# Patient Record
Sex: Male | Born: 1935 | Race: White | Hispanic: No
Health system: Southern US, Community
[De-identification: ages and names within clinical notes are randomized; demographics above are authoritative.]

## PROBLEM LIST (undated history)

## (undated) DIAGNOSIS — I480 Paroxysmal atrial fibrillation: Secondary | ICD-10-CM

## (undated) DIAGNOSIS — E43 Unspecified severe protein-calorie malnutrition: Secondary | ICD-10-CM

## (undated) DIAGNOSIS — Z9911 Dependence on respirator [ventilator] status: Secondary | ICD-10-CM

## (undated) DIAGNOSIS — N390 Urinary tract infection, site not specified: Secondary | ICD-10-CM

## (undated) DIAGNOSIS — J449 Chronic obstructive pulmonary disease, unspecified: Secondary | ICD-10-CM

## (undated) DIAGNOSIS — G8250 Quadriplegia, unspecified: Secondary | ICD-10-CM

---

## 2012-10-05 ENCOUNTER — Ambulatory Visit: Payer: Self-pay | Admitting: Internal Medicine

## 2012-10-21 ENCOUNTER — Inpatient Hospital Stay: Payer: Self-pay | Admitting: Student

## 2012-10-21 LAB — COMPREHENSIVE METABOLIC PANEL
Albumin: 3 g/dL — ABNORMAL LOW (ref 3.4–5.0)
Alkaline Phosphatase: 141 U/L — ABNORMAL HIGH (ref 50–136)
Anion Gap: 5 — ABNORMAL LOW (ref 7–16)
BUN: 61 mg/dL — ABNORMAL HIGH (ref 7–18)
Bilirubin,Total: 1.3 mg/dL — ABNORMAL HIGH (ref 0.2–1.0)
Calcium, Total: 10.1 mg/dL (ref 8.5–10.1)
Co2: 37 mmol/L — ABNORMAL HIGH (ref 21–32)
Creatinine: 1.19 mg/dL (ref 0.60–1.30)
EGFR (African American): >60
EGFR (Non-African Amer.): 54 — ABNORMAL LOW
Osmolality: 321 (ref 275–301)
Potassium: 3.7 mmol/L (ref 3.5–5.1)
SGOT(AST): 48 U/L — ABNORMAL HIGH (ref 15–37)
SGPT (ALT): 33 U/L (ref 12–78)
Sodium: 153 mmol/L — ABNORMAL HIGH (ref 136–145)
Total Protein: 7.1 g/dL (ref 6.4–8.2)

## 2012-10-21 LAB — CBC WITH DIFFERENTIAL/PLATELET
Bands: 15 %
HCT: 44.7 %
HGB: 14.1 g/dL
Lymphocytes: 11 %
MCH: 28.2 pg
MCHC: 31.5 g/dL — ABNORMAL LOW
MCV: 90 fL
Metamyelocyte: 2 %
Monocytes: 5 %
Platelet: 130 x10 3/mm 3 — ABNORMAL LOW
RBC: 5 x10 6/mm 3
RDW: 17 % — ABNORMAL HIGH
Segmented Neutrophils: 67 %
Variant Lymphocyte - H1-Rlymph: 1 %
WBC: 24.9 x10 3/mm 3 — ABNORMAL HIGH

## 2012-10-21 LAB — CK-MB: CK-MB: 5 ng/mL — ABNORMAL HIGH (ref 0.5–3.6)

## 2012-10-21 LAB — URINALYSIS, COMPLETE
Bacteria: NONE SEEN
Bilirubin,UR: NEGATIVE
Glucose,UR: NEGATIVE mg/dL (ref 0–75)
Ketone: NEGATIVE
RBC,UR: 59 /HPF (ref 0–5)
Specific Gravity: 1.018 (ref 1.003–1.030)
Squamous Epithelial: NONE SEEN
WBC UR: 14 /HPF (ref 0–5)

## 2012-10-21 LAB — TROPONIN I: Troponin-I: 0.27 ng/mL — ABNORMAL HIGH

## 2012-10-22 LAB — BASIC METABOLIC PANEL
Anion Gap: 6 — ABNORMAL LOW (ref 7–16)
Anion Gap: 5 — ABNORMAL LOW (ref 7–16)
BUN: 46 mg/dL — ABNORMAL HIGH (ref 7–18)
BUN: 38 mg/dL — ABNORMAL HIGH (ref 7–18)
Calcium, Total: 9.5 mg/dL (ref 8.5–10.1)
Calcium, Total: 9.4 mg/dL (ref 8.5–10.1)
Chloride: 114 mmol/L — ABNORMAL HIGH (ref 98–107)
Co2: 32 mmol/L (ref 21–32)
EGFR (African American): >60
EGFR (Non-African Amer.): 56 — ABNORMAL LOW
EGFR (Non-African Amer.): 54 — ABNORMAL LOW
Glucose: 203 mg/dL — ABNORMAL HIGH (ref 65–99)
Osmolality: 315 (ref 275–301)
Potassium: 3.5 mmol/L (ref 3.5–5.1)
Potassium: 3.6 mmol/L (ref 3.5–5.1)
Sodium: 151 mmol/L — ABNORMAL HIGH (ref 136–145)

## 2012-10-22 LAB — CBC WITH DIFFERENTIAL/PLATELET
Eosinophil #: 0.1 10*3/uL (ref 0.0–0.7)
Eosinophil %: 0.4 %
HCT: 41.7 % (ref 40.0–52.0)
HGB: 13.1 g/dL (ref 13.0–18.0)
Lymphocyte #: 1.2 10*3/uL (ref 1.0–3.6)
Lymphocyte %: 5.1 %
MCH: 28.3 pg (ref 26.0–34.0)
MCV: 90 fL (ref 80–100)
Monocyte #: 1.7 x10 3/mm — ABNORMAL HIGH (ref 0.2–1.0)
Monocyte %: 7.4 %
Neutrophil %: 86.8 %
Platelet: 131 10*3/uL — ABNORMAL LOW (ref 150–440)
RBC: 4.62 10*6/uL (ref 4.40–5.90)
RDW: 16.4 % — ABNORMAL HIGH (ref 11.5–14.5)
WBC: 23 10*3/uL — ABNORMAL HIGH (ref 3.8–10.6)

## 2012-10-22 LAB — DIGOXIN LEVEL: Digoxin: 1.15 ng/mL

## 2012-10-23 LAB — BASIC METABOLIC PANEL
Creatinine: 1.11 mg/dL (ref 0.60–1.30)
EGFR (Non-African Amer.): 59 — ABNORMAL LOW
Glucose: 169 mg/dL — ABNORMAL HIGH (ref 65–99)
Osmolality: 306 (ref 275–301)
Potassium: 3.2 mmol/L — ABNORMAL LOW (ref 3.5–5.1)
Sodium: 148 mmol/L — ABNORMAL HIGH (ref 136–145)

## 2012-10-23 LAB — CBC WITH DIFFERENTIAL/PLATELET
Basophil #: 0 10*3/uL (ref 0.0–0.1)
Eosinophil #: 0.2 10*3/uL (ref 0.0–0.7)
HGB: 12.2 g/dL — ABNORMAL LOW (ref 13.0–18.0)
Lymphocyte #: 1.2 10*3/uL (ref 1.0–3.6)
MCV: 90 fL (ref 80–100)
Monocyte #: 1.6 x10 3/mm — ABNORMAL HIGH (ref 0.2–1.0)
Neutrophil #: 16.1 10*3/uL — ABNORMAL HIGH (ref 1.4–6.5)
Platelet: 125 10*3/uL — ABNORMAL LOW (ref 150–440)
RBC: 4.3 10*6/uL — ABNORMAL LOW (ref 4.40–5.90)
RDW: 16.5 % — ABNORMAL HIGH (ref 11.5–14.5)
WBC: 19.1 10*3/uL — ABNORMAL HIGH (ref 3.8–10.6)

## 2012-10-24 LAB — BASIC METABOLIC PANEL
Anion Gap: 4 — ABNORMAL LOW (ref 7–16)
Calcium, Total: 8.6 mg/dL (ref 8.5–10.1)
Chloride: 111 mmol/L — ABNORMAL HIGH (ref 98–107)
Co2: 33 mmol/L — ABNORMAL HIGH (ref 21–32)
EGFR (African American): >60
Osmolality: 300 (ref 275–301)

## 2012-10-24 LAB — CBC WITH DIFFERENTIAL/PLATELET
Eosinophil #: 0.3 10*3/uL (ref 0.0–0.7)
HCT: 37.3 % — ABNORMAL LOW (ref 40.0–52.0)
HGB: 11.7 g/dL — ABNORMAL LOW (ref 13.0–18.0)
Lymphocyte %: 8.9 %
MCHC: 31.4 g/dL — ABNORMAL LOW (ref 32.0–36.0)
MCV: 90 fL (ref 80–100)
Monocyte #: 1.1 x10 3/mm — ABNORMAL HIGH (ref 0.2–1.0)
Monocyte %: 8.8 %
Neutrophil %: 79.1 %
WBC: 12.7 10*3/uL — ABNORMAL HIGH (ref 3.8–10.6)

## 2012-10-25 LAB — CBC WITH DIFFERENTIAL/PLATELET
Basophil #: 0.1 10*3/uL (ref 0.0–0.1)
Eosinophil #: 0.4 10*3/uL (ref 0.0–0.7)
HCT: 36.2 % — ABNORMAL LOW (ref 40.0–52.0)
Lymphocyte %: 11 %
MCH: 28.7 pg (ref 26.0–34.0)
MCHC: 32.2 g/dL (ref 32.0–36.0)
MCV: 89 fL (ref 80–100)
Monocyte #: 0.9 x10 3/mm (ref 0.2–1.0)
Monocyte %: 11 %
Neutrophil %: 72.4 %
Platelet: 137 10*3/uL — ABNORMAL LOW (ref 150–440)
RDW: 16.9 % — ABNORMAL HIGH (ref 11.5–14.5)
WBC: 8.3 10*3/uL (ref 3.8–10.6)

## 2012-10-25 LAB — BASIC METABOLIC PANEL
BUN: 26 mg/dL — ABNORMAL HIGH (ref 7–18)
Calcium, Total: 8.4 mg/dL — ABNORMAL LOW (ref 8.5–10.1)
EGFR (Non-African Amer.): >60
Osmolality: 295 (ref 275–301)
Potassium: 3.9 mmol/L (ref 3.5–5.1)
Sodium: 146 mmol/L — ABNORMAL HIGH (ref 136–145)

## 2012-10-26 LAB — CULTURE, BLOOD (SINGLE)

## 2012-10-26 LAB — URINE CULTURE

## 2012-11-02 ENCOUNTER — Ambulatory Visit: Payer: Self-pay | Admitting: Internal Medicine

## 2012-12-03 ENCOUNTER — Ambulatory Visit: Payer: Self-pay | Admitting: Internal Medicine

## 2012-12-14 ENCOUNTER — Inpatient Hospital Stay: Payer: Self-pay | Admitting: Internal Medicine

## 2012-12-14 LAB — URINALYSIS, COMPLETE
Ketone: NEGATIVE
Nitrite: POSITIVE
Protein: 30
RBC,UR: 14 /HPF (ref 0–5)
Specific Gravity: 1.023 (ref 1.003–1.030)
Squamous Epithelial: NONE SEEN
WBC UR: 254 /HPF (ref 0–5)

## 2012-12-14 LAB — COMPREHENSIVE METABOLIC PANEL
Albumin: 3 g/dL — ABNORMAL LOW (ref 3.4–5.0)
Alkaline Phosphatase: 118 U/L (ref 50–136)
Anion Gap: 3 — ABNORMAL LOW (ref 7–16)
BUN: 35 mg/dL — ABNORMAL HIGH (ref 7–18)
Bilirubin,Total: 2 mg/dL — ABNORMAL HIGH (ref 0.2–1.0)
Calcium, Total: 9 mg/dL (ref 8.5–10.1)
Co2: 38 mmol/L — ABNORMAL HIGH (ref 21–32)
EGFR (African American): >60
Glucose: 111 mg/dL — ABNORMAL HIGH (ref 65–99)
Osmolality: 284 (ref 275–301)
Potassium: 4 mmol/L (ref 3.5–5.1)
SGPT (ALT): 20 U/L (ref 12–78)
Sodium: 138 mmol/L (ref 136–145)

## 2012-12-14 LAB — CBC
HCT: 33.2 % — ABNORMAL LOW (ref 40.0–52.0)
MCHC: 31.3 g/dL — ABNORMAL LOW (ref 32.0–36.0)
MCV: 93 fL (ref 80–100)
Platelet: 149 10*3/uL — ABNORMAL LOW (ref 150–440)
RBC: 3.57 10*6/uL — ABNORMAL LOW (ref 4.40–5.90)
RDW: 16.5 % — ABNORMAL HIGH (ref 11.5–14.5)
WBC: 15.4 10*3/uL — ABNORMAL HIGH (ref 3.8–10.6)

## 2012-12-15 LAB — CBC WITH DIFFERENTIAL/PLATELET
Basophil #: 0 10*3/uL (ref 0.0–0.1)
Basophil %: 0.2 %
Eosinophil %: 0 %
HCT: 35 % — ABNORMAL LOW (ref 40.0–52.0)
MCV: 94 fL (ref 80–100)
Monocyte #: 0.2 x10 3/mm (ref 0.2–1.0)
Neutrophil #: 11.1 10*3/uL — ABNORMAL HIGH (ref 1.4–6.5)
Neutrophil %: 93.2 %
WBC: 11.9 10*3/uL — ABNORMAL HIGH (ref 3.8–10.6)

## 2012-12-15 LAB — BASIC METABOLIC PANEL
Anion Gap: 4 — ABNORMAL LOW (ref 7–16)
BUN: 26 mg/dL — ABNORMAL HIGH (ref 7–18)
Calcium, Total: 8.9 mg/dL (ref 8.5–10.1)
Chloride: 101 mmol/L (ref 98–107)
EGFR (African American): >60
EGFR (Non-African Amer.): >60
Osmolality: 288 (ref 275–301)

## 2012-12-15 LAB — URINE CULTURE

## 2012-12-16 LAB — BASIC METABOLIC PANEL
BUN: 36 mg/dL — ABNORMAL HIGH (ref 7–18)
Chloride: 109 mmol/L — ABNORMAL HIGH (ref 98–107)
Co2: 34 mmol/L — ABNORMAL HIGH (ref 21–32)
EGFR (Non-African Amer.): >60
Osmolality: 296 (ref 275–301)
Potassium: 4.2 mmol/L (ref 3.5–5.1)
Sodium: 145 mmol/L (ref 136–145)

## 2012-12-16 LAB — PRO B NATRIURETIC PEPTIDE: B-Type Natriuretic Peptide: 3560 pg/mL — ABNORMAL HIGH (ref 0–450)

## 2012-12-17 LAB — BASIC METABOLIC PANEL
Calcium, Total: 8.6 mg/dL (ref 8.5–10.1)
Chloride: 113 mmol/L — ABNORMAL HIGH (ref 98–107)
Creatinine: 1.03 mg/dL (ref 0.60–1.30)
EGFR (African American): >60
EGFR (Non-African Amer.): >60
Osmolality: 306 (ref 275–301)
Potassium: 4 mmol/L (ref 3.5–5.1)
Sodium: 148 mmol/L — ABNORMAL HIGH (ref 136–145)

## 2012-12-17 LAB — CBC WITH DIFFERENTIAL/PLATELET
Basophil #: 0.1 10*3/uL (ref 0.0–0.1)
Eosinophil %: 1.3 %
HGB: 11.2 g/dL — ABNORMAL LOW (ref 13.0–18.0)
Lymphocyte #: 1.1 10*3/uL (ref 1.0–3.6)
Lymphocyte %: 9.2 %
MCH: 29.3 pg (ref 26.0–34.0)
MCV: 94 fL (ref 80–100)
Monocyte %: 9.2 %
Neutrophil #: 9.6 10*3/uL — ABNORMAL HIGH (ref 1.4–6.5)
Neutrophil %: 79.5 %
RDW: 17 % — ABNORMAL HIGH (ref 11.5–14.5)
WBC: 12 10*3/uL — ABNORMAL HIGH (ref 3.8–10.6)

## 2012-12-18 LAB — CBC WITH DIFFERENTIAL/PLATELET
Eosinophil %: 3 %
HCT: 34.9 % — ABNORMAL LOW (ref 40.0–52.0)
Lymphocyte %: 14.6 %
MCH: 29.7 pg (ref 26.0–34.0)
MCV: 94 fL (ref 80–100)
Monocyte #: 1.2 x10 3/mm — ABNORMAL HIGH (ref 0.2–1.0)
Neutrophil #: 7.5 10*3/uL — ABNORMAL HIGH (ref 1.4–6.5)
Platelet: 173 10*3/uL (ref 150–440)
RBC: 3.73 10*6/uL — ABNORMAL LOW (ref 4.40–5.90)
WBC: 10.8 10*3/uL — ABNORMAL HIGH (ref 3.8–10.6)

## 2012-12-18 LAB — BASIC METABOLIC PANEL
BUN: 28 mg/dL — ABNORMAL HIGH (ref 7–18)
Calcium, Total: 8.3 mg/dL — ABNORMAL LOW (ref 8.5–10.1)
EGFR (African American): >60
Sodium: 148 mmol/L — ABNORMAL HIGH (ref 136–145)

## 2012-12-19 LAB — BASIC METABOLIC PANEL
Anion Gap: 3 — ABNORMAL LOW (ref 7–16)
BUN: 28 mg/dL — ABNORMAL HIGH (ref 7–18)
Creatinine: 0.86 mg/dL (ref 0.60–1.30)
EGFR (African American): >60
EGFR (Non-African Amer.): >60
Glucose: 75 mg/dL (ref 65–99)
Osmolality: 293 (ref 275–301)
Potassium: 4.2 mmol/L (ref 3.5–5.1)

## 2012-12-20 LAB — BASIC METABOLIC PANEL
Anion Gap: 1 — ABNORMAL LOW (ref 7–16)
BUN: 27 mg/dL — ABNORMAL HIGH (ref 7–18)
Calcium, Total: 8.5 mg/dL (ref 8.5–10.1)
Co2: 35 mmol/L — ABNORMAL HIGH (ref 21–32)
EGFR (African American): >60
EGFR (Non-African Amer.): >60
Osmolality: 291 (ref 275–301)
Potassium: 3.8 mmol/L (ref 3.5–5.1)
Sodium: 143 mmol/L (ref 136–145)

## 2012-12-20 LAB — CULTURE, BLOOD (SINGLE)

## 2012-12-21 LAB — BASIC METABOLIC PANEL
Calcium, Total: 8.7 mg/dL (ref 8.5–10.1)
Co2: 32 mmol/L (ref 21–32)
EGFR (Non-African Amer.): 58 — ABNORMAL LOW
Glucose: 119 mg/dL — ABNORMAL HIGH (ref 65–99)

## 2012-12-21 LAB — PRO B NATRIURETIC PEPTIDE: B-Type Natriuretic Peptide: 2207 pg/mL — ABNORMAL HIGH (ref 0–450)

## 2012-12-22 LAB — HEMOGLOBIN: HGB: 10.9 g/dL — ABNORMAL LOW (ref 13.0–18.0)

## 2012-12-22 LAB — PLATELET COUNT: Platelet: 198 10*3/uL (ref 150–440)

## 2012-12-23 LAB — BASIC METABOLIC PANEL
Anion Gap: 2 — ABNORMAL LOW (ref 7–16)
BUN: 27 mg/dL — ABNORMAL HIGH (ref 7–18)
Calcium, Total: 9 mg/dL (ref 8.5–10.1)
Chloride: 105 mmol/L (ref 98–107)
Creatinine: 0.98 mg/dL (ref 0.60–1.30)
EGFR (African American): >60
EGFR (Non-African Amer.): >60
Glucose: 109 mg/dL — ABNORMAL HIGH (ref 65–99)
Osmolality: 285 (ref 275–301)

## 2012-12-24 LAB — CBC WITH DIFFERENTIAL/PLATELET
Eosinophil %: 3.9 %
HCT: 34.2 % — ABNORMAL LOW (ref 40.0–52.0)
HGB: 10.7 g/dL — ABNORMAL LOW (ref 13.0–18.0)
Lymphocyte #: 1.6 10*3/uL (ref 1.0–3.6)
Lymphocyte %: 12.2 %
MCH: 29.6 pg (ref 26.0–34.0)
MCHC: 31.3 g/dL — ABNORMAL LOW (ref 32.0–36.0)
MCV: 95 fL (ref 80–100)
Monocyte #: 1.1 x10 3/mm — ABNORMAL HIGH (ref 0.2–1.0)
Monocyte %: 8.9 %
RDW: 17.4 % — ABNORMAL HIGH (ref 11.5–14.5)
WBC: 12.8 10*3/uL — ABNORMAL HIGH (ref 3.8–10.6)

## 2012-12-24 LAB — BASIC METABOLIC PANEL
BUN: 26 mg/dL — ABNORMAL HIGH (ref 7–18)
Calcium, Total: 9.2 mg/dL (ref 8.5–10.1)
Chloride: 102 mmol/L (ref 98–107)
EGFR (African American): >60
Glucose: 68 mg/dL (ref 65–99)
Osmolality: 279 (ref 275–301)
Sodium: 138 mmol/L (ref 136–145)

## 2012-12-25 LAB — CBC WITH DIFFERENTIAL/PLATELET
Basophil #: 0.1 10*3/uL (ref 0.0–0.1)
Eosinophil #: 0.4 10*3/uL (ref 0.0–0.7)
Eosinophil %: 3.2 %
HCT: 33.6 % — ABNORMAL LOW (ref 40.0–52.0)
HGB: 10.7 g/dL — ABNORMAL LOW (ref 13.0–18.0)
Lymphocyte %: 13.9 %
MCHC: 31.9 g/dL — ABNORMAL LOW (ref 32.0–36.0)
Neutrophil #: 7.9 10*3/uL — ABNORMAL HIGH (ref 1.4–6.5)
Neutrophil %: 71.6 %
RBC: 3.6 10*6/uL — ABNORMAL LOW (ref 4.40–5.90)
RDW: 17.1 % — ABNORMAL HIGH (ref 11.5–14.5)
WBC: 11 10*3/uL — ABNORMAL HIGH (ref 3.8–10.6)

## 2012-12-26 LAB — BASIC METABOLIC PANEL
Anion Gap: 3 — ABNORMAL LOW (ref 7–16)
BUN: 25 mg/dL — ABNORMAL HIGH (ref 7–18)
Chloride: 106 mmol/L (ref 98–107)
Creatinine: 0.96 mg/dL (ref 0.60–1.30)
EGFR (African American): >60
Glucose: 98 mg/dL (ref 65–99)
Osmolality: 286 (ref 275–301)
Potassium: 5 mmol/L (ref 3.5–5.1)

## 2012-12-26 LAB — CBC WITH DIFFERENTIAL/PLATELET
Basophil %: 1 %
Eosinophil #: 0.4 10*3/uL (ref 0.0–0.7)
Eosinophil %: 3.7 %
HCT: 33.3 % — ABNORMAL LOW (ref 40.0–52.0)
HGB: 10.6 g/dL — ABNORMAL LOW (ref 13.0–18.0)
Lymphocyte %: 13.8 %
MCHC: 31.9 g/dL — ABNORMAL LOW (ref 32.0–36.0)
MCV: 94 fL (ref 80–100)
Monocyte #: 1.1 x10 3/mm — ABNORMAL HIGH (ref 0.2–1.0)
Monocyte %: 10.5 %
Neutrophil #: 7.3 10*3/uL — ABNORMAL HIGH (ref 1.4–6.5)
Platelet: 209 10*3/uL (ref 150–440)
RBC: 3.52 10*6/uL — ABNORMAL LOW (ref 4.40–5.90)
RDW: 17 % — ABNORMAL HIGH (ref 11.5–14.5)

## 2012-12-26 LAB — MAGNESIUM: Magnesium: 1.9 mg/dL

## 2013-01-02 ENCOUNTER — Ambulatory Visit: Payer: Self-pay | Admitting: Internal Medicine

## 2013-02-19 ENCOUNTER — Inpatient Hospital Stay: Payer: Self-pay | Admitting: Internal Medicine

## 2013-02-19 LAB — COMPREHENSIVE METABOLIC PANEL
Alkaline Phosphatase: 195 U/L — ABNORMAL HIGH (ref 50–136)
Anion Gap: 5 — ABNORMAL LOW (ref 7–16)
BUN: 37 mg/dL — ABNORMAL HIGH (ref 7–18)
Bilirubin,Total: 0.8 mg/dL (ref 0.2–1.0)
Calcium, Total: 9.9 mg/dL (ref 8.5–10.1)
Creatinine: 0.92 mg/dL (ref 0.60–1.30)
EGFR (Non-African Amer.): >60
Glucose: 92 mg/dL (ref 65–99)
SGOT(AST): 64 U/L — ABNORMAL HIGH (ref 15–37)
SGPT (ALT): 54 U/L (ref 12–78)
Sodium: 143 mmol/L (ref 136–145)
Total Protein: 7 g/dL (ref 6.4–8.2)

## 2013-02-19 LAB — URINALYSIS, COMPLETE
RBC,UR: 13572 /HPF (ref 0–5)
Squamous Epithelial: NONE SEEN

## 2013-02-19 LAB — CBC
HCT: 39 % — ABNORMAL LOW (ref 40.0–52.0)
MCV: 87 fL (ref 80–100)

## 2013-02-20 LAB — HEMOGLOBIN: HGB: 11.9 g/dL — ABNORMAL LOW (ref 13.0–18.0)

## 2013-02-20 LAB — CBC WITH DIFFERENTIAL/PLATELET
Eosinophil %: 2.2 %
HCT: 37.2 % — ABNORMAL LOW (ref 40.0–52.0)
Lymphocyte #: 2 10*3/uL (ref 1.0–3.6)
Lymphocyte %: 15.6 %
MCH: 27.5 pg (ref 26.0–34.0)
MCHC: 31.8 g/dL — ABNORMAL LOW (ref 32.0–36.0)
MCV: 86 fL (ref 80–100)
Monocyte #: 1.2 x10 3/mm — ABNORMAL HIGH (ref 0.2–1.0)
Neutrophil #: 9.3 10*3/uL — ABNORMAL HIGH (ref 1.4–6.5)
Neutrophil %: 71.6 %
RBC: 4.31 10*6/uL — ABNORMAL LOW (ref 4.40–5.90)
RDW: 16 % — ABNORMAL HIGH (ref 11.5–14.5)
WBC: 13 10*3/uL — ABNORMAL HIGH (ref 3.8–10.6)

## 2013-02-20 LAB — BASIC METABOLIC PANEL
BUN: 42 mg/dL — ABNORMAL HIGH (ref 7–18)
Creatinine: 1.16 mg/dL (ref 0.60–1.30)
EGFR (African American): >60
EGFR (Non-African Amer.): 55 — ABNORMAL LOW
Glucose: 161 mg/dL — ABNORMAL HIGH (ref 65–99)
Osmolality: 297 (ref 275–301)
Potassium: 4.6 mmol/L (ref 3.5–5.1)
Sodium: 142 mmol/L (ref 136–145)

## 2013-04-04 ENCOUNTER — Ambulatory Visit: Payer: Self-pay | Admitting: Internal Medicine

## 2013-04-18 ENCOUNTER — Inpatient Hospital Stay: Payer: Self-pay | Admitting: Internal Medicine

## 2013-04-18 LAB — COMPREHENSIVE METABOLIC PANEL
Alkaline Phosphatase: 178 U/L — ABNORMAL HIGH (ref 50–136)
Anion Gap: 2 — ABNORMAL LOW (ref 7–16)
BUN: 41 mg/dL — ABNORMAL HIGH (ref 7–18)
Bilirubin,Total: 1.6 mg/dL — ABNORMAL HIGH (ref 0.2–1.0)
Calcium, Total: 9.9 mg/dL (ref 8.5–10.1)
Chloride: 101 mmol/L (ref 98–107)
Creatinine: 0.84 mg/dL (ref 0.60–1.30)
EGFR (African American): >60
Glucose: 77 mg/dL (ref 65–99)
Osmolality: 286 (ref 275–301)
Potassium: 5.4 mmol/L — ABNORMAL HIGH (ref 3.5–5.1)
SGOT(AST): 67 U/L — ABNORMAL HIGH (ref 15–37)
SGPT (ALT): 38 U/L (ref 12–78)
Sodium: 139 mmol/L (ref 136–145)
Total Protein: 7.8 g/dL (ref 6.4–8.2)

## 2013-04-18 LAB — TROPONIN I: Troponin-I: 0.07 ng/mL — ABNORMAL HIGH

## 2013-04-18 LAB — CBC
HCT: 39 % — ABNORMAL LOW (ref 40.0–52.0)
HGB: 12.8 g/dL — ABNORMAL LOW (ref 13.0–18.0)
MCV: 85 fL (ref 80–100)
Platelet: 169 10*3/uL (ref 150–440)
RBC: 4.62 10*6/uL (ref 4.40–5.90)

## 2013-04-18 LAB — URINALYSIS, COMPLETE
Glucose,UR: NEGATIVE mg/dL (ref 0–75)
Ketone: NEGATIVE
Protein: NEGATIVE
RBC,UR: 210 /HPF (ref 0–5)
Squamous Epithelial: NONE SEEN
WBC UR: 87 /HPF (ref 0–5)

## 2013-04-18 LAB — DIGOXIN LEVEL: Digoxin: 0.9 ng/mL

## 2013-04-18 LAB — CK TOTAL AND CKMB (NOT AT ARMC): CK, Total: 115 U/L (ref 35–232)

## 2013-04-18 LAB — PRO B NATRIURETIC PEPTIDE: B-Type Natriuretic Peptide: 3020 pg/mL — ABNORMAL HIGH (ref 0–450)

## 2013-04-19 DIAGNOSIS — R4182 Altered mental status, unspecified: Secondary | ICD-10-CM

## 2013-04-19 DIAGNOSIS — I4891 Unspecified atrial fibrillation: Secondary | ICD-10-CM

## 2013-04-19 DIAGNOSIS — I471 Supraventricular tachycardia: Secondary | ICD-10-CM

## 2013-04-19 DIAGNOSIS — R7989 Other specified abnormal findings of blood chemistry: Secondary | ICD-10-CM

## 2013-04-19 LAB — BASIC METABOLIC PANEL
Anion Gap: 7 (ref 7–16)
BUN: 38 mg/dL — ABNORMAL HIGH (ref 7–18)
Calcium, Total: 8.8 mg/dL (ref 8.5–10.1)
Chloride: 108 mmol/L — ABNORMAL HIGH (ref 98–107)
Co2: 29 mmol/L (ref 21–32)
EGFR (African American): >60
Osmolality: 295 (ref 275–301)
Potassium: 4.4 mmol/L (ref 3.5–5.1)
Sodium: 144 mmol/L (ref 136–145)

## 2013-04-19 LAB — TROPONIN I: Troponin-I: 0.08 ng/mL — ABNORMAL HIGH

## 2013-04-19 LAB — CK TOTAL AND CKMB (NOT AT ARMC)
CK, Total: 62 U/L (ref 35–232)
CK-MB: 5.8 ng/mL — ABNORMAL HIGH (ref 0.5–3.6)
CK-MB: 6 ng/mL — ABNORMAL HIGH (ref 0.5–3.6)

## 2013-04-21 LAB — BASIC METABOLIC PANEL
Anion Gap: 0 — ABNORMAL LOW (ref 7–16)
Calcium, Total: 8.7 mg/dL (ref 8.5–10.1)
Co2: 35 mmol/L — ABNORMAL HIGH (ref 21–32)
Creatinine: 0.8 mg/dL (ref 0.60–1.30)
EGFR (Non-African Amer.): >60
Glucose: 105 mg/dL — ABNORMAL HIGH (ref 65–99)
Osmolality: 290 (ref 275–301)
Potassium: 4.4 mmol/L (ref 3.5–5.1)
Sodium: 143 mmol/L (ref 136–145)

## 2013-04-22 LAB — URINALYSIS, COMPLETE
Bacteria: NONE SEEN
Bilirubin,UR: NEGATIVE
Ketone: NEGATIVE
Protein: NEGATIVE
RBC,UR: 569 /HPF (ref 0–5)
Squamous Epithelial: <1

## 2013-04-23 LAB — CULTURE, BLOOD (SINGLE)

## 2013-04-29 ENCOUNTER — Other Ambulatory Visit: Payer: Self-pay | Admitting: Family Medicine

## 2013-04-29 LAB — URINALYSIS, COMPLETE
Bilirubin,UR: NEGATIVE
Ketone: NEGATIVE
Nitrite: NEGATIVE
Ph: 5 (ref 4.5–8.0)
RBC,UR: 1 /HPF (ref 0–5)
Specific Gravity: 1.017 (ref 1.003–1.030)
Squamous Epithelial: <1
WBC UR: 4 /HPF (ref 0–5)

## 2013-04-29 LAB — COMPREHENSIVE METABOLIC PANEL
Albumin: 3.6 g/dL (ref 3.4–5.0)
Anion Gap: 2 — ABNORMAL LOW (ref 7–16)
Creatinine: 1.04 mg/dL (ref 0.60–1.30)
EGFR (African American): >60
Glucose: 122 mg/dL — ABNORMAL HIGH (ref 65–99)
Osmolality: 288 (ref 275–301)
Potassium: 5.1 mmol/L (ref 3.5–5.1)
SGOT(AST): 49 U/L — ABNORMAL HIGH (ref 15–37)
SGPT (ALT): 46 U/L (ref 12–78)
Sodium: 139 mmol/L (ref 136–145)
Total Protein: 7.5 g/dL (ref 6.4–8.2)

## 2013-04-29 LAB — CBC WITH DIFFERENTIAL/PLATELET
Basophil #: 0.1 10*3/uL (ref 0.0–0.1)
Eosinophil %: 5.8 %
HCT: 42.7 % (ref 40.0–52.0)
Lymphocyte #: 1.8 10*3/uL (ref 1.0–3.6)
Lymphocyte %: 15.9 %
MCH: 27.7 pg (ref 26.0–34.0)
MCHC: 32 g/dL (ref 32.0–36.0)
MCV: 86 fL (ref 80–100)
Monocyte %: 8.6 %
Neutrophil #: 7.7 10*3/uL — ABNORMAL HIGH (ref 1.4–6.5)
Neutrophil %: 68.5 %
Platelet: 206 10*3/uL (ref 150–440)
RBC: 4.95 10*6/uL (ref 4.40–5.90)
WBC: 11.3 10*3/uL — ABNORMAL HIGH (ref 3.8–10.6)

## 2013-04-30 LAB — URINE CULTURE

## 2013-05-01 ENCOUNTER — Other Ambulatory Visit: Payer: Self-pay | Admitting: Family Medicine

## 2013-05-05 ENCOUNTER — Ambulatory Visit: Payer: Self-pay | Admitting: Internal Medicine

## 2013-05-05 ENCOUNTER — Emergency Department: Payer: Self-pay | Admitting: Emergency Medicine

## 2013-05-05 LAB — CBC
HCT: 40.9 % (ref 40.0–52.0)
MCHC: 32.8 g/dL (ref 32.0–36.0)
MCV: 86 fL (ref 80–100)
Platelet: 169 10*3/uL (ref 150–440)
RBC: 4.77 10*6/uL (ref 4.40–5.90)
RDW: 17.1 % — ABNORMAL HIGH (ref 11.5–14.5)
WBC: 10.8 10*3/uL — ABNORMAL HIGH (ref 3.8–10.6)

## 2013-05-05 LAB — COMPREHENSIVE METABOLIC PANEL
Albumin: 3.2 g/dL — ABNORMAL LOW (ref 3.4–5.0)
Alkaline Phosphatase: 178 U/L — ABNORMAL HIGH (ref 50–136)
Anion Gap: 0 — ABNORMAL LOW (ref 7–16)
Bilirubin,Total: 0.5 mg/dL (ref 0.2–1.0)
Creatinine: 0.98 mg/dL (ref 0.60–1.30)
EGFR (African American): >60
Glucose: 120 mg/dL — ABNORMAL HIGH (ref 65–99)
SGOT(AST): 53 U/L — ABNORMAL HIGH (ref 15–37)

## 2013-05-23 ENCOUNTER — Inpatient Hospital Stay: Payer: Self-pay | Admitting: Student

## 2013-05-23 LAB — CBC
HCT: 43.9 % (ref 40.0–52.0)
HGB: 13.7 g/dL (ref 13.0–18.0)
MCH: 27.3 pg (ref 26.0–34.0)
MCHC: 31.2 g/dL — ABNORMAL LOW (ref 32.0–36.0)
MCV: 88 fL (ref 80–100)
Platelet: 312 10*3/uL (ref 150–440)
RBC: 5.01 10*6/uL (ref 4.40–5.90)
RDW: 16.8 % — ABNORMAL HIGH (ref 11.5–14.5)
WBC: 16.4 10*3/uL — ABNORMAL HIGH (ref 3.8–10.6)

## 2013-05-23 LAB — URINALYSIS, COMPLETE
Bilirubin,UR: NEGATIVE
Ketone: NEGATIVE
Nitrite: NEGATIVE
Ph: 5 (ref 4.5–8.0)
Protein: 30
RBC,UR: 6 /HPF (ref 0–5)
Squamous Epithelial: <1
WBC UR: 5 /HPF (ref 0–5)

## 2013-05-23 LAB — CBC WITH DIFFERENTIAL/PLATELET
Basophil #: 0.1 10*3/uL (ref 0.0–0.1)
Basophil %: 0.3 %
HGB: 12.2 g/dL — ABNORMAL LOW (ref 13.0–18.0)
MCH: 26.8 pg (ref 26.0–34.0)
MCHC: 31.3 g/dL — ABNORMAL LOW (ref 32.0–36.0)
MCV: 86 fL (ref 80–100)
Monocyte #: 1.8 x10 3/mm — ABNORMAL HIGH (ref 0.2–1.0)
Neutrophil #: 21.2 10*3/uL — ABNORMAL HIGH (ref 1.4–6.5)
Neutrophil %: 87 %
Platelet: 270 10*3/uL (ref 150–440)
WBC: 24.4 10*3/uL — ABNORMAL HIGH (ref 3.8–10.6)

## 2013-05-23 LAB — COMPREHENSIVE METABOLIC PANEL
Bilirubin,Total: 0.7 mg/dL (ref 0.2–1.0)
Calcium, Total: 11.4 mg/dL — ABNORMAL HIGH (ref 8.5–10.1)
Chloride: 108 mmol/L — ABNORMAL HIGH (ref 98–107)
Creatinine: 1.52 mg/dL — ABNORMAL HIGH (ref 0.60–1.30)
EGFR (African American): 46 — ABNORMAL LOW
Glucose: 198 mg/dL — ABNORMAL HIGH (ref 65–99)
Osmolality: 320 (ref 275–301)
Potassium: 4.1 mmol/L (ref 3.5–5.1)
SGPT (ALT): 25 U/L (ref 12–78)

## 2013-05-23 LAB — CK: CK, Total: 36 U/L (ref 35–232)

## 2013-05-23 LAB — BASIC METABOLIC PANEL
Anion Gap: 8 (ref 7–16)
BUN: 45 mg/dL — ABNORMAL HIGH (ref 7–18)
Glucose: 129 mg/dL — ABNORMAL HIGH (ref 65–99)
Osmolality: 317 (ref 275–301)
Sodium: 153 mmol/L — ABNORMAL HIGH (ref 136–145)

## 2013-05-23 LAB — AMMONIA: Ammonia, Plasma: <25 mcmol/L (ref 11–32)

## 2013-05-23 LAB — TROPONIN I: Troponin-I: 0.15 ng/mL — ABNORMAL HIGH

## 2013-05-23 LAB — PHOSPHORUS: Phosphorus: 7.5 mg/dL — ABNORMAL HIGH (ref 2.5–4.9)

## 2013-05-24 LAB — CBC WITH DIFFERENTIAL/PLATELET
Eosinophil #: 0 10*3/uL (ref 0.0–0.7)
HCT: 36.1 % — ABNORMAL LOW (ref 40.0–52.0)
MCHC: 32.4 g/dL (ref 32.0–36.0)
MCV: 84 fL (ref 80–100)
Neutrophil #: 18.1 10*3/uL — ABNORMAL HIGH (ref 1.4–6.5)
Platelet: 262 10*3/uL (ref 150–440)
RBC: 4.28 10*6/uL — ABNORMAL LOW (ref 4.40–5.90)
RDW: 15.9 % — ABNORMAL HIGH (ref 11.5–14.5)

## 2013-05-24 LAB — BASIC METABOLIC PANEL
Anion Gap: 5 — ABNORMAL LOW (ref 7–16)
BUN: 36 mg/dL — ABNORMAL HIGH (ref 7–18)
Chloride: 112 mmol/L — ABNORMAL HIGH (ref 98–107)
Co2: 30 mmol/L (ref 21–32)
Co2: 28 mmol/L (ref 21–32)
Creatinine: 1.38 mg/dL — ABNORMAL HIGH (ref 0.60–1.30)
EGFR (Non-African Amer.): 45 — ABNORMAL LOW
EGFR (Non-African Amer.): 52 — ABNORMAL LOW
Glucose: 149 mg/dL — ABNORMAL HIGH (ref 65–99)
Glucose: 155 mg/dL — ABNORMAL HIGH (ref 65–99)
Osmolality: 305 (ref 275–301)
Osmolality: 293 (ref 275–301)

## 2013-05-24 LAB — CK-MB: CK-MB: 2.3 ng/mL (ref 0.5–3.6)

## 2013-05-24 LAB — PHOSPHORUS
Phosphorus: 1 mg/dL — CL (ref 2.5–4.9)
Phosphorus: 5.5 mg/dL — ABNORMAL HIGH (ref 2.5–4.9)

## 2013-05-24 LAB — TROPONIN I: Troponin-I: 0.14 ng/mL — ABNORMAL HIGH

## 2013-05-24 LAB — DIGOXIN LEVEL: Digoxin: 0.51 ng/mL

## 2013-05-24 LAB — MAGNESIUM
Magnesium: 2 mg/dL
Magnesium: 1.8 mg/dL

## 2013-05-25 LAB — CBC WITH DIFFERENTIAL/PLATELET
Basophil #: 0.1 10*3/uL (ref 0.0–0.1)
Basophil %: 0.6 %
HCT: 33.8 % — ABNORMAL LOW (ref 40.0–52.0)
HGB: 10.6 g/dL — ABNORMAL LOW (ref 13.0–18.0)
Lymphocyte #: 0.7 10*3/uL — ABNORMAL LOW (ref 1.0–3.6)
Monocyte #: 0.8 x10 3/mm (ref 0.2–1.0)
Neutrophil #: 14.4 10*3/uL — ABNORMAL HIGH (ref 1.4–6.5)
Neutrophil %: 90.4 %
RDW: 16.7 % — ABNORMAL HIGH (ref 11.5–14.5)
WBC: 15.9 10*3/uL — ABNORMAL HIGH (ref 3.8–10.6)

## 2013-05-25 LAB — BASIC METABOLIC PANEL
Calcium, Total: 7.2 mg/dL — ABNORMAL LOW (ref 8.5–10.1)
Chloride: 107 mmol/L (ref 98–107)
Creatinine: 1.26 mg/dL (ref 0.60–1.30)
EGFR (Non-African Amer.): 50 — ABNORMAL LOW
Glucose: 120 mg/dL — ABNORMAL HIGH (ref 65–99)
Osmolality: 287 (ref 275–301)

## 2013-05-25 LAB — URINE CULTURE

## 2013-05-25 LAB — PHOSPHORUS: Phosphorus: 4.3 mg/dL (ref 2.5–4.9)

## 2013-05-26 LAB — CBC WITH DIFFERENTIAL/PLATELET
Basophil #: 0 10*3/uL (ref 0.0–0.1)
Basophil %: 0.2 %
Eosinophil %: 0 %
HGB: 10.5 g/dL — ABNORMAL LOW (ref 13.0–18.0)
Lymphocyte %: 6.1 %
MCH: 27.6 pg (ref 26.0–34.0)
MCHC: 32 g/dL (ref 32.0–36.0)
MCV: 86 fL (ref 80–100)
Monocyte #: 0.4 x10 3/mm (ref 0.2–1.0)
Neutrophil #: 6.9 10*3/uL — ABNORMAL HIGH (ref 1.4–6.5)
Platelet: 161 10*3/uL (ref 150–440)
RBC: 3.79 10*6/uL — ABNORMAL LOW (ref 4.40–5.90)
RDW: 16.6 % — ABNORMAL HIGH (ref 11.5–14.5)

## 2013-05-26 LAB — COMPREHENSIVE METABOLIC PANEL
Alkaline Phosphatase: 92 U/L (ref 50–136)
Anion Gap: 6 — ABNORMAL LOW (ref 7–16)
BUN: 50 mg/dL — ABNORMAL HIGH (ref 7–18)
Bilirubin,Total: 0.4 mg/dL (ref 0.2–1.0)
Calcium, Total: 7.1 mg/dL — ABNORMAL LOW (ref 8.5–10.1)
EGFR (African American): 58 — ABNORMAL LOW
Osmolality: 287 (ref 275–301)
Potassium: 4.3 mmol/L (ref 3.5–5.1)
SGOT(AST): 20 U/L (ref 15–37)
SGPT (ALT): 14 U/L (ref 12–78)
Sodium: 136 mmol/L (ref 136–145)

## 2013-05-26 LAB — MAGNESIUM: Magnesium: 2 mg/dL

## 2013-05-27 LAB — CBC WITH DIFFERENTIAL/PLATELET
Basophil %: 0.1 %
Eosinophil #: 0 10*3/uL (ref 0.0–0.7)
Eosinophil %: 0 %
HCT: 33.1 % — ABNORMAL LOW (ref 40.0–52.0)
MCH: 27.8 pg (ref 26.0–34.0)
MCHC: 32.4 g/dL (ref 32.0–36.0)
MCV: 86 fL (ref 80–100)
Monocyte #: 0.5 x10 3/mm (ref 0.2–1.0)
Monocyte %: 5.1 %
Platelet: 157 10*3/uL (ref 150–440)
RBC: 3.85 10*6/uL — ABNORMAL LOW (ref 4.40–5.90)
RDW: 16.6 % — ABNORMAL HIGH (ref 11.5–14.5)
WBC: 9 10*3/uL (ref 3.8–10.6)

## 2013-05-27 LAB — COMPREHENSIVE METABOLIC PANEL
Calcium, Total: 7.8 mg/dL — ABNORMAL LOW (ref 8.5–10.1)
EGFR (African American): >60
EGFR (Non-African Amer.): 56 — ABNORMAL LOW
Potassium: 3.9 mmol/L (ref 3.5–5.1)
SGOT(AST): 16 U/L (ref 15–37)
SGPT (ALT): 16 U/L (ref 12–78)
Total Protein: 5.3 g/dL — ABNORMAL LOW (ref 6.4–8.2)

## 2013-05-28 LAB — CULTURE, BLOOD (SINGLE)

## 2013-05-29 LAB — CBC WITH DIFFERENTIAL/PLATELET
Basophil #: 0 10*3/uL (ref 0.0–0.1)
Basophil %: 0.3 %
Eosinophil %: 0 %
HCT: 37 % — ABNORMAL LOW (ref 40.0–52.0)
HGB: 11.9 g/dL — ABNORMAL LOW (ref 13.0–18.0)
Lymphocyte #: 0.4 10*3/uL — ABNORMAL LOW (ref 1.0–3.6)
Lymphocyte %: 2.5 %
MCHC: 32 g/dL (ref 32.0–36.0)
MCV: 86 fL (ref 80–100)
Monocyte %: 5.6 %
RDW: 17.4 % — ABNORMAL HIGH (ref 11.5–14.5)
WBC: 14.7 10*3/uL — ABNORMAL HIGH (ref 3.8–10.6)

## 2013-05-29 LAB — BASIC METABOLIC PANEL
Calcium, Total: 8 mg/dL — ABNORMAL LOW (ref 8.5–10.1)
Creatinine: 1.1 mg/dL (ref 0.60–1.30)
EGFR (Non-African Amer.): 59 — ABNORMAL LOW
Potassium: 4.9 mmol/L (ref 3.5–5.1)
Sodium: 151 mmol/L — ABNORMAL HIGH (ref 136–145)

## 2013-05-30 LAB — POTASSIUM: Potassium: 5.5 mmol/L — ABNORMAL HIGH (ref 3.5–5.1)

## 2013-05-30 LAB — BASIC METABOLIC PANEL
BUN: 45 mg/dL — ABNORMAL HIGH (ref 7–18)
Calcium, Total: 9 mg/dL (ref 8.5–10.1)
Co2: 38 mmol/L — ABNORMAL HIGH (ref 21–32)
EGFR (Non-African Amer.): 59 — ABNORMAL LOW
Glucose: 152 mg/dL — ABNORMAL HIGH (ref 65–99)
Potassium: 4.6 mmol/L (ref 3.5–5.1)
Sodium: 153 mmol/L — ABNORMAL HIGH (ref 136–145)

## 2013-05-31 LAB — CBC WITH DIFFERENTIAL/PLATELET
Basophil #: 0 10*3/uL (ref 0.0–0.1)
Eosinophil #: 0 10*3/uL (ref 0.0–0.7)
HGB: 9.9 g/dL — ABNORMAL LOW (ref 13.0–18.0)
Lymphocyte #: 0.6 10*3/uL — ABNORMAL LOW (ref 1.0–3.6)
Lymphocyte %: 4.8 %
MCH: 27.4 pg (ref 26.0–34.0)
Monocyte %: 9.7 %
Neutrophil %: 85.3 %
WBC: 13.2 10*3/uL — ABNORMAL HIGH (ref 3.8–10.6)

## 2013-05-31 LAB — BASIC METABOLIC PANEL
BUN: 39 mg/dL — ABNORMAL HIGH (ref 7–18)
Co2: 37 mmol/L — ABNORMAL HIGH (ref 21–32)
Creatinine: 1.02 mg/dL (ref 0.60–1.30)
EGFR (African American): >60
EGFR (Non-African Amer.): >60
Glucose: 121 mg/dL — ABNORMAL HIGH (ref 65–99)
Potassium: 3.7 mmol/L (ref 3.5–5.1)
Sodium: 153 mmol/L — ABNORMAL HIGH (ref 136–145)

## 2013-05-31 LAB — MAGNESIUM: Magnesium: 2.1 mg/dL

## 2013-06-01 LAB — CBC WITH DIFFERENTIAL/PLATELET
Eosinophil #: 0 10*3/uL (ref 0.0–0.7)
Eosinophil %: 0 %
HGB: 10 g/dL — ABNORMAL LOW (ref 13.0–18.0)
Lymphocyte #: 0.4 10*3/uL — ABNORMAL LOW (ref 1.0–3.6)
Lymphocyte %: 2.5 %
MCV: 86 fL (ref 80–100)
Neutrophil #: 13.2 10*3/uL — ABNORMAL HIGH (ref 1.4–6.5)
Platelet: 135 10*3/uL — ABNORMAL LOW (ref 150–440)
RDW: 17 % — ABNORMAL HIGH (ref 11.5–14.5)
WBC: 15.1 10*3/uL — ABNORMAL HIGH (ref 3.8–10.6)

## 2013-06-01 LAB — BASIC METABOLIC PANEL
Calcium, Total: 7.6 mg/dL — ABNORMAL LOW (ref 8.5–10.1)
Chloride: 111 mmol/L — ABNORMAL HIGH (ref 98–107)
Co2: 37 mmol/L — ABNORMAL HIGH (ref 21–32)
EGFR (African American): >60
Potassium: 3.8 mmol/L (ref 3.5–5.1)
Sodium: 149 mmol/L — ABNORMAL HIGH (ref 136–145)

## 2013-06-02 ENCOUNTER — Inpatient Hospital Stay
Admission: AD | Admit: 2013-06-02 | Discharge: 2013-07-11 | Disposition: A | Discharge status: Home Health | Payer: MEDICARE | Source: Ambulatory Visit | Attending: Internal Medicine | Admitting: Internal Medicine

## 2013-06-02 DIAGNOSIS — Z9911 Dependence on respirator [ventilator] status: Secondary | ICD-10-CM

## 2013-06-02 DIAGNOSIS — J449 Chronic obstructive pulmonary disease, unspecified: Secondary | ICD-10-CM

## 2013-06-02 DIAGNOSIS — J9600 Acute respiratory failure, unspecified whether with hypoxia or hypercapnia: Secondary | ICD-10-CM

## 2013-06-02 DIAGNOSIS — S129XXA Fracture of neck, unspecified, initial encounter: Secondary | ICD-10-CM

## 2013-06-02 DIAGNOSIS — R0902 Hypoxemia: Secondary | ICD-10-CM

## 2013-06-02 DIAGNOSIS — R4182 Altered mental status, unspecified: Secondary | ICD-10-CM

## 2013-06-02 DIAGNOSIS — Z930 Tracheostomy status: Secondary | ICD-10-CM

## 2013-06-02 LAB — CBC WITH DIFFERENTIAL/PLATELET
Basophil #: 0.1 10*3/uL (ref 0.0–0.1)
Basophil %: 0.8 %
Eosinophil #: 0 10*3/uL (ref 0.0–0.7)
Eosinophil %: 0 %
HCT: 31.6 % — ABNORMAL LOW (ref 40.0–52.0)
HGB: 10.1 g/dL — ABNORMAL LOW (ref 13.0–18.0)
Lymphocyte #: 0.5 10*3/uL — ABNORMAL LOW (ref 1.0–3.6)
Monocyte #: 0.7 x10 3/mm (ref 0.2–1.0)
Monocyte %: 5.5 %
Neutrophil %: 89.4 %
Platelet: 132 10*3/uL — ABNORMAL LOW (ref 150–440)
RBC: 3.68 10*6/uL — ABNORMAL LOW (ref 4.40–5.90)

## 2013-06-02 LAB — BLOOD GAS, ARTERIAL
Acid-Base Excess: 5.7 mmol/L — ABNORMAL HIGH (ref 0.0–2.0)
Bicarbonate: 30.6 mEq/L — ABNORMAL HIGH (ref 20.0–24.0)
FIO2: 28 %
O2 Saturation: 98.2 %
PEEP: 5 cmH2O
RATE: 12 resp/min
TCO2: 32.3 mmol/L (ref 0–100)
pO2, Arterial: 99.4 mmHg (ref 80.0–100.0)

## 2013-06-02 LAB — BASIC METABOLIC PANEL
Anion Gap: 3 — ABNORMAL LOW (ref 7–16)
Calcium, Total: 7.9 mg/dL — ABNORMAL LOW (ref 8.5–10.1)
Co2: 33 mmol/L — ABNORMAL HIGH (ref 21–32)
Creatinine: 0.91 mg/dL (ref 0.60–1.30)
EGFR (Non-African Amer.): >60

## 2013-06-03 ENCOUNTER — Other Ambulatory Visit (HOSPITAL_COMMUNITY): Payer: Self-pay

## 2013-06-03 DIAGNOSIS — R0902 Hypoxemia: Secondary | ICD-10-CM

## 2013-06-03 DIAGNOSIS — R4182 Altered mental status, unspecified: Secondary | ICD-10-CM

## 2013-06-03 DIAGNOSIS — J9600 Acute respiratory failure, unspecified whether with hypoxia or hypercapnia: Secondary | ICD-10-CM

## 2013-06-03 LAB — BLOOD GAS, ARTERIAL
FIO2: 0.28 %
MECHVT: 500 mL
PEEP: 5 cmH2O
Patient temperature: 98.6
RATE: 12 resp/min
TCO2: 32.6 mmol/L (ref 0–100)
pH, Arterial: 7.422 (ref 7.350–7.450)

## 2013-06-03 LAB — CBC
HCT: 33.8 % — ABNORMAL LOW (ref 39.0–52.0)
MCHC: 30.8 g/dL (ref 30.0–36.0)
MCV: 88.5 fL (ref 78.0–100.0)
Platelets: 122 10*3/uL — ABNORMAL LOW (ref 150–400)
RBC: 3.82 MIL/uL — ABNORMAL LOW (ref 4.22–5.81)
RDW: 17.4 % — ABNORMAL HIGH (ref 11.5–15.5)

## 2013-06-03 LAB — TSH: TSH: 1.041 u[IU]/mL (ref 0.350–4.500)

## 2013-06-03 LAB — COMPREHENSIVE METABOLIC PANEL
AST: 23 U/L (ref 0–37)
Albumin: 2.1 g/dL — ABNORMAL LOW (ref 3.5–5.2)
BUN: 37 mg/dL — ABNORMAL HIGH (ref 6–23)
Creatinine, Ser: 0.73 mg/dL (ref 0.50–1.35)
GFR calc Af Amer: >90 mL/min (ref >90)
Glucose, Bld: 116 mg/dL — ABNORMAL HIGH (ref 70–99)
Total Bilirubin: 0.6 mg/dL (ref 0.3–1.2)
Total Protein: 5 g/dL — ABNORMAL LOW (ref 6.0–8.3)

## 2013-06-03 LAB — PREALBUMIN: Prealbumin: 22.9 mg/dL (ref 17.0–34.0)

## 2013-06-03 LAB — PROCALCITONIN: Procalcitonin: 1.56 ng/mL

## 2013-06-03 NOTE — Consult Note (Signed)
PULMONARY  / CRITICAL CARE MEDICINE  Name: Deegan Valentino MRN: [id number] DOB: 27-Mar-1921    ADMISSION DATE:  06/02/2013 CONSULTATION DATE:  9-30  REFERRING MD :  [facility] PRIMARY SERVICE: [facility]  CHIEF COMPLAINT:  VDRF    SIGNIFICANT EVENTS / STUDIES:    LINES / TUBES: OTT>> 2023/06/24  CULTURES:   ANTIBIOTICS: Per im  HISTORY OF PRESENT ILLNESS:   79 yo former copd with chronic dysphagia who went to nursing home late august for UTI. Fell and had cervical fx that was non displaced and treated with cervical collar. He was tx to [facility] and extubated and sent to rehab. DC home and was found tobe hypercarbic and required reintubation and subsequent tx to [facility]. Daughter(HCPOA)had refused trach in past but is now receptive. PCCM asked to evaluate.   PAST MEDICAL HISTORY :  COPD on home O2 Dementia CAF CHF PPM AAA Chronic dysphagia Chronic uti PTSD  Prior to Admission medications   reviewed   Allergies not on file primaxin xopenex Sulfa albuterol FAMILY HISTORY:  Gerty in 79 yo SOCIAL HISTORY: Quit smoking at age 32 Lives with daughter REVIEW OF SYSTEMS:  NA    VITAL SIGNS: Vital signs reviewed. Abnormal values will appear under impression plan section.   VENTILATOR SETTINGS:   INTAKE / OUTPUT: Intake/Output   None     PHYSICAL EXAMINATION: General:  Frail EWM Neuro:  Anxious but follows commands HEENT:  OTT-> vent Cardiovascular:  HSIR IR Lungs:  Decrease bs bilat Abdomen:  +bs Musculoskeletal:  intact Skin:  Warm ++edema  LABS:  CBC Recent Labs     06/03/13  0855  WBC  15.6*  HGB  10.4*  HCT  33.8*  PLT  122*   Coag's No results found for this basename: APTT, INR,  in the last 72 hours BMET No results found for this basename: NA, K, CL, CO2, BUN, CREATININE, GLUCOSE,  in the last 72 hours Electrolytes No results found for this basename: CALCIUM, MG, PHOS,  in the last 72 hours Sepsis Markers Recent Labs     06/03/13  0855  PROCALCITON  1.56    ABG Recent Labs     06/02/13  2000  PHART  7.382  PCO2ART  52.7*  PO2ART  99.4   Liver Enzymes No results found for this basename: AST, ALT, ALKPHOS, BILITOT, ALBUMIN,  in the last 72 hours Cardiac Enzymes No results found for this basename: TROPONINI, PROBNP,  in the last 72 hours Glucose No results found for this basename: GLUCAP,  in the last 72 hours  Imaging Dg Chest Port 1 View  06/03/2013   *RADIOLOGY REPORT*  Clinical Data: Respiratory failure  PORTABLE CHEST - 1 VIEW  Comparison: None.  Findings: The patient is intubated.  The tip of the ET tube is 5.3 cm above the carina.  Right subclavian cardiac rhythm maintenance device.  The lead projects over the right ventricle.  Cardiomegaly with both right and left heart enlargement.  Atherosclerotic and highly ectatic thoracic aorta.  Probable small bilateral layering pleural effusions with associated bibasilar opacities.  Background mild vascular congestion without overt edema.  Moderate osteoarthritis of the bilateral shoulder joints.  IMPRESSION:  1.  The tip of endotracheal tube is 5.3 cm above the carina. 2.  Small bilateral pleural effusions with associated bibasilar opacities which may reflect pleural fluid combined with atelectasis, or infiltrate. The dependent pulmonary edema is also a possibility. 3.  Otherwise, mild pulmonary vascular congestion without overt edema in the upper  and mid lungs. 4.  Cardiomegaly with atherosclerotic and ectatic thoracic aorta.   Original Report Authenticated By: Malachy Moan, M.D.       ASSESSMENT / PLAN:  PULMONARY A:Chronic hypercarbic resp failure in setting of COPD, chronic aspiration. P:   See trach discussion below. Trach in AM then weaning. Consider treatment for depression but will defer to primary. Wean steroids as able. Note on xopenex but carried allergy to xopenex  CARDIOVASCULAR A: CHF. AFIB, CHF P:  Per IM  NEUROLOGIC A:  Dementia, Anxiety  Disorder  P:    Continue propofol for now, will discuss discontinuation post trach.  Brett Canales Minor ACNP [facility] PCCM Pager [phone] till 3 pm If no answer page [phone] 06/03/2013, 10:35 AM  Patient seen and examined, agree with above note, spoke with daughter extensively regarding trach/peg, she is fully aware of the risk of doing a tracheostomy with a c-collar, risk of paralysis, bleeding, infection and vocal cord damage.  She is asking for trach inspite of risk.  Spoke with respiratory, will proceed with tracheostomy in AM at 11:30, NPO after midnight, hold heparin after midnight and check INR in AM.  CC time 35 min.  Patient seen and examined, agree with above note.  I dictated the care and orders written for this patient under my direction.  Alyson Reedy, MD [phone]

## 2013-06-04 ENCOUNTER — Encounter (HOSPITAL_COMMUNITY): Payer: Self-pay

## 2013-06-04 ENCOUNTER — Ambulatory Visit: Payer: Self-pay | Admitting: Internal Medicine

## 2013-06-04 ENCOUNTER — Other Ambulatory Visit (HOSPITAL_COMMUNITY): Payer: Self-pay

## 2013-06-04 LAB — BASIC METABOLIC PANEL
BUN: 41 mg/dL — ABNORMAL HIGH (ref 6–23)
CO2: 27 mEq/L (ref 19–32)
Calcium: 8.4 mg/dL (ref 8.4–10.5)
Chloride: 108 mEq/L (ref 96–112)
Creatinine, Ser: 0.73 mg/dL (ref 0.50–1.35)
GFR calc non Af Amer: 79 mL/min — ABNORMAL LOW (ref >90)
Glucose, Bld: 91 mg/dL (ref 70–99)

## 2013-06-04 LAB — PROTIME-INR: INR: 1.03 (ref 0.00–1.49)

## 2013-06-04 LAB — CBC WITH DIFFERENTIAL/PLATELET
Eosinophils Absolute: 0 10*3/uL (ref 0.0–0.7)
Eosinophils Relative: 0 % (ref 0–5)
Hemoglobin: 9.5 g/dL — ABNORMAL LOW (ref 13.0–17.0)
Lymphs Abs: 0.8 10*3/uL (ref 0.7–4.0)
MCH: 27.3 pg (ref 26.0–34.0)
MCHC: 31.6 g/dL (ref 30.0–36.0)
MCV: 86.5 fL (ref 78.0–100.0)
Monocytes Relative: 7 % (ref 3–12)
Neutrophils Relative %: 89 % — ABNORMAL HIGH (ref 43–77)
Platelets: 100 10*3/uL — ABNORMAL LOW (ref 150–400)
RBC: 3.48 MIL/uL — ABNORMAL LOW (ref 4.22–5.81)
RDW: 17.3 % — ABNORMAL HIGH (ref 11.5–15.5)

## 2013-06-04 MED ORDER — FENTANYL CITRATE 0.05 MG/ML IJ SOLN: 25.0000 ug | INTRAMUSCULAR | Status: AC | PRN

## 2013-06-04 NOTE — Progress Notes (Signed)
PULMONARY  / CRITICAL CARE MEDICINE  Name: Tanner Wu MRN: [id number] DOB: 11/01/1920    ADMISSION DATE:  06/02/2013 CONSULTATION DATE:  9-30  REFERRING MD :  [facility] PRIMARY SERVICE: [facility]  CHIEF COMPLAINT:  VDRF    SIGNIFICANT EVENTS / STUDIES:    LINES / TUBES: OTT>> 9-27  CULTURES:   ANTIBIOTICS: Per im  HISTORY OF PRESENT ILLNESS:   79 yo former copd with chronic dysphagia who went to nursing home late august for UTI. Fell and had cervical fx that was non displaced and treated with cervical collar. He was tx to [facility] and extubated and sent to rehab. DC home and was found tobe hypercarbic and required reintubation and subsequent tx to [facility]. Daughter(HCPOA)had refused trach in past but is now receptive. PCCM asked to evaluate.   VITAL SIGNS: Vital signs reviewed. Abnormal values will appear under impression plan section.   VENTILATOR SETTINGS:   INTAKE / OUTPUT: Intake/Output   None     PHYSICAL EXAMINATION: General:  Frail EWM Neuro:  Anxious but follows commands HEENT:  OTT-> vent Cardiovascular:  HSIR IR Lungs:  Decrease bs bilat Abdomen:  +bs Musculoskeletal:  intact Skin:  Warm ++edema  LABS:  CBC Recent Labs     06/03/13  0855  06/04/13  0620  WBC  15.6*  18.5*  HGB  10.4*  9.5*  HCT  33.8*  30.1*  PLT  122*  100*   Coag's Recent Labs     06/04/13  0620  INR  1.03   BMET Recent Labs     06/03/13  0855  06/04/13  0620  NA  147*  143  K  4.3  4.5  CL  110  108  CO2  29  27  BUN  37*  41*  CREATININE  0.73  0.73  GLUCOSE  116*  91   Electrolytes Recent Labs     06/03/13  0855  06/04/13  0620  CALCIUM  8.7  8.4   Sepsis Markers Recent Labs     06/03/13  0855  PROCALCITON  1.56   ABG Recent Labs     06/02/13  2000  06/03/13  0802  PHART  7.382  7.422  PCO2ART  52.7*  48.6*  PO2ART  99.4  115.0*   Liver Enzymes Recent Labs     06/03/13  0855  AST  23  ALT  27  ALKPHOS  93  BILITOT  0.6  ALBUMIN  2.1*   Cardiac  Enzymes No results found for this basename: TROPONINI, PROBNP,  in the last 72 hours Glucose No results found for this basename: GLUCAP,  in the last 72 hours  Imaging Chest Portable 1 View To Assess Tube Placement And Rule-out Pneumothorax  06/04/2013   CLINICAL DATA:  Tracheostomy tube placement  EXAM: PORTABLE CHEST - 1 VIEW  COMPARISON:  Portable exam 1339 hr compared to 06/03/2013  FINDINGS: New tracheostomy tube projects over the tracheal air column.  Right subclavian pacemaker lead tip projects over right ventricle.  Enlargement of cardiac silhouette with pulmonary vascular congestion.  Calcified tortuous thoracic aorta.  Pulmonary vascular congestion with minimal perihilar infiltrate question edema.  No pneumothorax.  Cannot exclude small bibasilar effusions.  IMPRESSION: No pneumothorax following tracheostomy tube placement.  Question CHF and small bibasilar effusions.   Electronically Signed   By: Mark  Boles M.D.   On: 06/04/2013 14:00   Dg Chest Port 1 View  06/03/2013   *RADIOLOGY REPORT*  Clinical Data: Respiratory   failure  PORTABLE CHEST - 1 VIEW  Comparison: None.  Findings: The patient is intubated.  The tip of the ET tube is 5.3 cm above the carina.  Right subclavian cardiac rhythm maintenance device.  The lead projects over the right ventricle.  Cardiomegaly with both right and left heart enlargement.  Atherosclerotic and highly ectatic thoracic aorta.  Probable small bilateral layering pleural effusions with associated bibasilar opacities.  Background mild vascular congestion without overt edema.  Moderate osteoarthritis of the bilateral shoulder joints.  IMPRESSION:  1.  The tip of endotracheal tube is 5.3 cm above the carina. 2.  Small bilateral pleural effusions with associated bibasilar opacities which may reflect pleural fluid combined with atelectasis, or infiltrate. The dependent pulmonary edema is also a possibility. 3.  Otherwise, mild pulmonary vascular congestion without  overt edema in the upper and mid lungs. 4.  Cardiomegaly with atherosclerotic and ectatic thoracic aorta.   Original Report Authenticated By: Heath McCullough, M.D.   ASSESSMENT / PLAN:  PULMONARY A:Chronic hypercarbic resp failure in setting of COPD, chronic aspiration. P:   Trach today. Weaning per protocol. Consider treatment for depression but will defer to primary. Wean steroids as he weans. Note on xopenex but carried allergy to xopenex  CARDIOVASCULAR A: CHF. AFIB, CHF P:  Per IM  NEUROLOGIC A:  Dementia, Anxiety  Disorder  P:   Continue propofol for now, will discuss discontinuation post trach.  Boni Maclellan G. Eria Lozoya, M.D. [facility] [facility]. Pager: [phone]. After hours pager: [phone]. 

## 2013-06-04 NOTE — Consult Note (Deleted)
PULMONARY  / CRITICAL CARE MEDICINE  Name: Tanner Wu MRN: [id number] DOB: 1920/10/11    ADMISSION DATE:  06/02/2013 CONSULTATION DATE:  9-30  REFERRING MD :  [facility] PRIMARY SERVICE: [facility]  CHIEF COMPLAINT:  VDRF    SIGNIFICANT EVENTS / STUDIES:    LINES / TUBES: OTT>> 06-06-23  CULTURES:   ANTIBIOTICS: Per im  HISTORY OF PRESENT ILLNESS:   79 yo former copd with chronic dysphagia who went to nursing home late august for UTI. Fell and had cervical fx that was non displaced and treated with cervical collar. He was tx to [facility] and extubated and sent to rehab. DC home and was found tobe hypercarbic and required reintubation and subsequent tx to [facility]. Daughter(HCPOA)had refused trach in past but is now receptive. PCCM asked to evaluate.   VITAL SIGNS: Vital signs reviewed. Abnormal values will appear under impression plan section.   VENTILATOR SETTINGS:   INTAKE / OUTPUT: Intake/Output   None     PHYSICAL EXAMINATION: General:  Frail EWM Neuro:  Anxious but follows commands HEENT:  OTT-> vent Cardiovascular:  HSIR IR Lungs:  Decrease bs bilat Abdomen:  +bs Musculoskeletal:  intact Skin:  Warm ++edema  LABS:  CBC Recent Labs     06/03/13  0855  06/04/13  0620  WBC  15.6*  18.5*  HGB  10.4*  9.5*  HCT  33.8*  30.1*  PLT  122*  100*   Coag's Recent Labs     06/04/13  0620  INR  1.03   BMET Recent Labs     06/03/13  0855  06/04/13  0620  NA  147*  143  K  4.3  4.5  CL  110  108  CO2  29  27  BUN  37*  41*  CREATININE  0.73  0.73  GLUCOSE  116*  91   Electrolytes Recent Labs     06/03/13  0855  06/04/13  0620  CALCIUM  8.7  8.4   Sepsis Markers Recent Labs     06/03/13  0855  PROCALCITON  1.56   ABG Recent Labs     06/02/13  2000  06/03/13  0802  PHART  7.382  7.422  PCO2ART  52.7*  48.6*  PO2ART  99.4  115.0*   Liver Enzymes Recent Labs     06/03/13  0855  AST  23  ALT  27  ALKPHOS  93  BILITOT  0.6  ALBUMIN  2.1*   Cardiac  Enzymes No results found for this basename: TROPONINI, PROBNP,  in the last 72 hours Glucose No results found for this basename: GLUCAP,  in the last 72 hours  Imaging Chest Portable 1 View To Assess Tube Placement And Rule-out Pneumothorax  06/04/2013   CLINICAL DATA:  Tracheostomy tube placement  EXAM: PORTABLE CHEST - 1 VIEW  COMPARISON:  Portable exam 1339 hr compared to 06/03/2013  FINDINGS: New tracheostomy tube projects over the tracheal air column.  Right subclavian pacemaker lead tip projects over right ventricle.  Enlargement of cardiac silhouette with pulmonary vascular congestion.  Calcified tortuous thoracic aorta.  Pulmonary vascular congestion with minimal perihilar infiltrate question edema.  No pneumothorax.  Cannot exclude small bibasilar effusions.  IMPRESSION: No pneumothorax following tracheostomy tube placement.  Question CHF and small bibasilar effusions.   Electronically Signed   By: Ulyses Southward M.D.   On: 06/04/2013 14:00   Dg Chest Port 1 View  06/03/2013   *RADIOLOGY REPORT*  Clinical Data: Respiratory  failure  PORTABLE CHEST - 1 VIEW  Comparison: None.  Findings: The patient is intubated.  The tip of the ET tube is 5.3 cm above the carina.  Right subclavian cardiac rhythm maintenance device.  The lead projects over the right ventricle.  Cardiomegaly with both right and left heart enlargement.  Atherosclerotic and highly ectatic thoracic aorta.  Probable small bilateral layering pleural effusions with associated bibasilar opacities.  Background mild vascular congestion without overt edema.  Moderate osteoarthritis of the bilateral shoulder joints.  IMPRESSION:  1.  The tip of endotracheal tube is 5.3 cm above the carina. 2.  Small bilateral pleural effusions with associated bibasilar opacities which may reflect pleural fluid combined with atelectasis, or infiltrate. The dependent pulmonary edema is also a possibility. 3.  Otherwise, mild pulmonary vascular congestion without  overt edema in the upper and mid lungs. 4.  Cardiomegaly with atherosclerotic and ectatic thoracic aorta.   Original Report Authenticated By: Malachy Moan, M.D.   ASSESSMENT / PLAN:  PULMONARY A:Chronic hypercarbic resp failure in setting of COPD, chronic aspiration. P:   Trach today. Weaning per protocol. Consider treatment for depression but will defer to primary. Wean steroids as he weans. Note on xopenex but carried allergy to xopenex  CARDIOVASCULAR A: CHF. AFIB, CHF P:  Per IM  NEUROLOGIC A:  Dementia, Anxiety  Disorder  P:   Continue propofol for now, will discuss discontinuation post trach.  Alyson Reedy, M.D. [facility] Pulmonary/Critical Care Medicine. Pager: [phone]. After hours pager: [phone].

## 2013-06-04 NOTE — Procedures (Signed)
Bedside Tracheostomy Insertion Procedure Note   Patient Details:   Name: Tanner Wu DOB: 1921/05/10 MRN: [id number]  Procedure: Tracheostomy  Pre Procedure Assessment: ET Tube Size:7.5 ET Tube secured at lip (cm): 21 Bite block in place: No Breath Sounds: Clear  Post Procedure Assessment: O2 sats: stable throughout Complications: No apparent complications Patient did tolerate procedure well Tracheostomy Brand:Shiley Tracheostomy Style:Cuffed Tracheostomy Size: 6.0 Tracheostomy Secured WUJ:[id number], velcro Tracheostomy Placement Confirmation:Trach cuff visualized and in place and Chest X ray ordered for placement    Jacqulynn Cadet 06/04/2013, 01:50 PM

## 2013-06-04 NOTE — Procedures (Signed)
Bronchoscopy  for Percutaneous  Tracheostomy  Name: Tanner Wu MRN: [id number] DOB: [birth year] Procedure: Bronchoscopy for Percutaneous Tracheostomy Indications: Diagnostic evaluation of the airways In conjunction with: Dr. Molli Knock  Procedure Details Consent: Risks of procedure as well as the alternatives and risks of each were explained to the (patient/caregiver).  Consent for procedure obtained. Time Out: Verified patient identification, verified procedure, site/side was marked, verified correct patient position, special equipment/implants available, medications/allergies/relevent history reviewed, required imaging and test results available.  Performed  In preparation for procedure, patient was given 100% FiO2 and bronchoscope lubricated. Sedation: Benzodiazepines, Muscle relaxants and Etomidate  Airway entered and the following bronchi were examined: Bronchi.   Procedures performed: Endotracheal Tube retracted in 2 cm increments to 17 cms. Cannulation of airway observed. Dilation observed. Placement of trachel tube  observed . No overt complications. Bronchoscope removed.  , Patient placed back on 100% FiO2 at conclusion of procedure.    Evaluation Hemodynamic Status: BP stable throughout; O2 sats: stable throughout Patient's Current Condition: stable Specimens:  None Complications: No apparent complications Patient did tolerate procedure well.   Brett Canales Minor ACNP [facility] PCCM Pager [phone] till 3 pm  perfromed with Neck was stable entire procedure  Mcarthur Rossetti. Tyson Alias, MD, FACP Pgr: [phone] [facility]  If no answer page [phone] 06/04/2013, 1:29 PM

## 2013-06-04 NOTE — Procedures (Signed)
Bedside Percutaneous Tracheostomy Placement  Consent from daughter, patient sedated and paralyzed, area cleaned, lidocaine injected, skin incision done followed by blunt dissection, airway entered and visualized bronchoscopically, wire placed followed by airway crush and dilated, size 6 cuffless trach in place.  Trach sutured and visualized bronchoscopically well above carina, minimal blood loss.  The patient's neck was manually stabilized during the entire procedure.  Alyson Reedy, M.D. [facility] Pulmonary/Critical Care Medicine. Pager: [phone]. After hours pager: [phone].

## 2013-06-05 ENCOUNTER — Other Ambulatory Visit (HOSPITAL_COMMUNITY): Payer: Self-pay

## 2013-06-05 LAB — CBC
MCH: 27.6 pg (ref 26.0–34.0)
MCHC: 32.2 g/dL (ref 30.0–36.0)
Platelets: 96 10*3/uL — ABNORMAL LOW (ref 150–400)
RDW: 17.1 % — ABNORMAL HIGH (ref 11.5–15.5)

## 2013-06-05 LAB — BASIC METABOLIC PANEL
BUN: 46 mg/dL — ABNORMAL HIGH (ref 6–23)
Calcium: 8.5 mg/dL (ref 8.4–10.5)
GFR calc Af Amer: >90 mL/min (ref >90)
GFR calc non Af Amer: 79 mL/min — ABNORMAL LOW (ref >90)
Glucose, Bld: 93 mg/dL (ref 70–99)
Potassium: 3.8 mEq/L (ref 3.5–5.1)
Sodium: 143 mEq/L (ref 135–145)

## 2013-06-06 DIAGNOSIS — I359 Nonrheumatic aortic valve disorder, unspecified: Secondary | ICD-10-CM

## 2013-06-06 LAB — BLOOD GAS, ARTERIAL
Acid-Base Excess: 1 mmol/L (ref 0.0–2.0)
Bicarbonate: 24.9 mEq/L — ABNORMAL HIGH (ref 20.0–24.0)
FIO2: 28 %
O2 Saturation: 91.6 %
RATE: 16 resp/min
TCO2: 26.1 mmol/L (ref 0–100)
pCO2 arterial: 38.2 mmHg (ref 35.0–45.0)
pO2, Arterial: 60.5 mmHg — ABNORMAL LOW (ref 80.0–100.0)

## 2013-06-06 LAB — CBC WITH DIFFERENTIAL/PLATELET
Eosinophils Absolute: 0 10*3/uL (ref 0.0–0.7)
HCT: 30.5 % — ABNORMAL LOW (ref 39.0–52.0)
Lymphs Abs: 0.5 10*3/uL — ABNORMAL LOW (ref 0.7–4.0)
MCH: 27 pg (ref 26.0–34.0)
Monocytes Relative: 2 % — ABNORMAL LOW (ref 3–12)
Neutro Abs: 15 10*3/uL — ABNORMAL HIGH (ref 1.7–7.7)
Neutrophils Relative %: 95 % — ABNORMAL HIGH (ref 43–77)
Platelets: 74 10*3/uL — ABNORMAL LOW (ref 150–400)
RBC: 3.63 MIL/uL — ABNORMAL LOW (ref 4.22–5.81)
WBC: 15.7 10*3/uL — ABNORMAL HIGH (ref 4.0–10.5)

## 2013-06-06 LAB — BASIC METABOLIC PANEL
Calcium: 8.5 mg/dL (ref 8.4–10.5)
Chloride: 105 mEq/L (ref 96–112)
GFR calc Af Amer: >90 mL/min (ref >90)
GFR calc non Af Amer: 81 mL/min — ABNORMAL LOW (ref >90)
Glucose, Bld: 107 mg/dL — ABNORMAL HIGH (ref 70–99)
Potassium: 4 mEq/L (ref 3.5–5.1)
Sodium: 140 mEq/L (ref 135–145)

## 2013-06-06 NOTE — Progress Notes (Signed)
Echocardiogram 2D Echocardiogram has been performed.  Tanner Wu 06/06/2013, 9:48 AM

## 2013-06-06 NOTE — Progress Notes (Signed)
PULMONARY  / CRITICAL CARE MEDICINE  Name: Tanner Wu MRN: [id number] DOB: [birth year]    ADMISSION DATE:  06/02/2013 CONSULTATION DATE:  9-30  REFERRING MD :  [facility] PRIMARY SERVICE: [facility]  CHIEF COMPLAINT:  VDRF  LINES / TUBES: OTT>> 9-27>>>10/2 Trach Ninetta Lights) 10/2>>>  CULTURES: None  ANTIBIOTICS: Per im  HISTORY OF PRESENT ILLNESS:   79 yo former copd with chronic dysphagia who went to nursing home late august for UTI. Fell and had cervical fx that was non displaced and treated with cervical collar. He was tx to [facility] and extubated and sent to rehab. DC home and was found tobe hypercarbic and required reintubation and subsequent tx to [facility]. Daughter(HCPOA)had refused trach in past but is now receptive. PCCM asked to evaluate.   VITAL SIGNS: Vital signs reviewed. Abnormal values will appear under impression plan section.   VENTILATOR SETTINGS:  PRVC 18, Tv 8, FiO2 35% and PEEP 5.  INTAKE / OUTPUT: Intake/Output   None    PHYSICAL EXAMINATION: General:  Frail EWM Neuro:  Anxious but follows commands, trach in place. HEENT:  OTT-> vent Cardiovascular:  HSIR IR Lungs:  Decrease bs bilat Abdomen:  +bs Musculoskeletal:  intact Skin:  Warm ++edema  LABS:  CBC Recent Labs     06/04/13  0620  06/05/13  0838  06/06/13  0600  WBC  18.5*  15.2*  15.7*  HGB  9.5*  9.5*  9.8*  HCT  30.1*  29.5*  30.5*  PLT  100*  96*  74*   Coag's Recent Labs     06/04/13  0620  INR  1.03   BMET Recent Labs     06/04/13  0620  06/05/13  0838  06/06/13  0600  NA  143  143  140  K  4.5  3.8  4.0  CL  108  107  105  CO2  27  29  22   BUN  41*  46*  44*  CREATININE  0.73  0.73  0.70  GLUCOSE  91  93  107*   Electrolytes Recent Labs     06/04/13  0620  06/05/13  0838  06/06/13  0600  CALCIUM  8.4  8.5  8.5   Sepsis Markers No results found for this basename: LACTICACIDVEN, PROCALCITON, O2SATVEN,  in the last 72 hours ABG Recent Labs     06/06/13  0440  PHART  7.430   PCO2ART  38.2  PO2ART  60.5*   Liver Enzymes No results found for this basename: AST, ALT, ALKPHOS, BILITOT, ALBUMIN,  in the last 72 hours Cardiac Enzymes Recent Labs     06/06/13  0600  PROBNP  11017.0*   Glucose No results found for this basename: GLUCAP,  in the last 72 hours  Imaging Dg Chest Port 1 View  06/05/2013   CLINICAL DATA:  New tracheostomy has been placed.  EXAM: PORTABLE CHEST - 1 VIEW  COMPARISON:  06/04/2013.  FINDINGS: Cardiomegaly. Moderate vascular congestion. Single lead pacemaker unchanged in position.  The tip of the tracheostomy tube projects over the air column and is an apparent good position. Correlate with there flow and respiratory parameters.  Bilateral pleural effusions appears stable. There is no pneumothorax. The bones appear slightly demineralized with degenerative change both shoulders.  IMPRESSION: Tracheostomy tube in apparent good position following replacement. No pneumothorax. Unchanged aeration.   Electronically Signed   By: Davonna Belling M.D.   On: 06/05/2013 07:28   Chest Portable 1 View To  Assess Tube Placement And Rule-out Pneumothorax  06/04/2013   CLINICAL DATA:  Tracheostomy tube placement  EXAM: PORTABLE CHEST - 1 VIEW  COMPARISON:  Portable exam 1339 hr compared to 06/03/2013  FINDINGS: New tracheostomy tube projects over the tracheal air column.  Right subclavian pacemaker lead tip projects over right ventricle.  Enlargement of cardiac silhouette with pulmonary vascular congestion.  Calcified tortuous thoracic aorta.  Pulmonary vascular congestion with minimal perihilar infiltrate question edema.  No pneumothorax.  Cannot exclude small bibasilar effusions.  IMPRESSION: No pneumothorax following tracheostomy tube placement.  Question CHF and small bibasilar effusions.   Electronically Signed   By: Ulyses Southward M.D.   On: 06/04/2013 14:00   ASSESSMENT / PLAN:  PULMONARY A:Chronic hypercarbic resp failure in setting of COPD, chronic  aspiration.  Desaturating this AM P:   - Trach in place. - Weaning per protocol. - CXR today, if atelectasis will consider bronch. - Wean steroids as he weans.  CARDIOVASCULAR A: CHF. AFIB, CHF P:  - Per IM  NEUROLOGIC A:  Dementia, Anxiety  Disorder  P:   D/C sedation but control anxiety and depression treatment recommended.  Alyson Reedy, M.D. [facility] Pulmonary/Critical Care Medicine. Pager: [phone]. After hours pager: [phone].

## 2013-06-07 LAB — EXPECTORATED SPUTUM ASSESSMENT W GRAM STAIN, RFLX TO RESP C

## 2013-06-07 LAB — URINALYSIS, ROUTINE W REFLEX MICROSCOPIC
Bilirubin Urine: NEGATIVE
Ketones, ur: NEGATIVE mg/dL
Nitrite: NEGATIVE
Protein, ur: NEGATIVE mg/dL
Urobilinogen, UA: 1 mg/dL (ref 0.0–1.0)
pH: 5 (ref 5.0–8.0)

## 2013-06-07 LAB — URINE MICROSCOPIC-ADD ON

## 2013-06-08 ENCOUNTER — Other Ambulatory Visit (HOSPITAL_COMMUNITY): Payer: Self-pay

## 2013-06-08 LAB — CBC WITH DIFFERENTIAL/PLATELET
Basophils Relative: 0 % (ref 0–1)
HCT: 26.7 % — ABNORMAL LOW (ref 39.0–52.0)
Hemoglobin: 8.6 g/dL — ABNORMAL LOW (ref 13.0–17.0)
MCH: 27.3 pg (ref 26.0–34.0)
MCHC: 32.2 g/dL (ref 30.0–36.0)
Monocytes Absolute: 0.2 10*3/uL (ref 0.1–1.0)
Monocytes Relative: 2 % — ABNORMAL LOW (ref 3–12)
Neutro Abs: 12.4 10*3/uL — ABNORMAL HIGH (ref 1.7–7.7)
RDW: 17.2 % — ABNORMAL HIGH (ref 11.5–15.5)

## 2013-06-08 LAB — BLOOD GAS, ARTERIAL
Acid-Base Excess: 1.8 mmol/L (ref 0.0–2.0)
MECHVT: 500 mL
Patient temperature: 98.6
RATE: 12 resp/min
TCO2: 27.3 mmol/L (ref 0–100)
pCO2 arterial: 41.5 mmHg (ref 35.0–45.0)
pH, Arterial: 7.413 (ref 7.350–7.450)
pO2, Arterial: 113 mmHg — ABNORMAL HIGH (ref 80.0–100.0)

## 2013-06-08 LAB — BASIC METABOLIC PANEL
BUN: 45 mg/dL — ABNORMAL HIGH (ref 6–23)
CO2: 25 mEq/L (ref 19–32)
Chloride: 105 mEq/L (ref 96–112)
GFR calc Af Amer: >90 mL/min (ref >90)
Glucose, Bld: 106 mg/dL — ABNORMAL HIGH (ref 70–99)
Potassium: 4 mEq/L (ref 3.5–5.1)

## 2013-06-09 LAB — BLOOD GAS, ARTERIAL
Acid-Base Excess: 1.5 mmol/L (ref 0.0–2.0)
Bicarbonate: 26.1 mEq/L — ABNORMAL HIGH (ref 20.0–24.0)
Expiratory PAP: 5
FIO2: 0.28 %
O2 Saturation: 98.4 %
TCO2: 27.5 mmol/L (ref 0–100)
pCO2 arterial: 45.2 mmHg — ABNORMAL HIGH (ref 35.0–45.0)
pO2, Arterial: 113 mmHg — ABNORMAL HIGH (ref 80.0–100.0)

## 2013-06-09 LAB — BASIC METABOLIC PANEL
CO2: 23 mEq/L (ref 19–32)
Calcium: 6.2 mg/dL — CL (ref 8.4–10.5)
Creatinine, Ser: 0.55 mg/dL (ref 0.50–1.35)
GFR calc non Af Amer: 89 mL/min — ABNORMAL LOW (ref >90)
Glucose, Bld: 81 mg/dL (ref 70–99)
Potassium: 4.3 mEq/L (ref 3.5–5.1)

## 2013-06-09 LAB — CBC
MCH: 27.9 pg (ref 26.0–34.0)
MCV: 84.2 fL (ref 78.0–100.0)
Platelets: 55 10*3/uL — ABNORMAL LOW (ref 150–400)
RDW: 16.8 % — ABNORMAL HIGH (ref 11.5–15.5)

## 2013-06-09 NOTE — Progress Notes (Signed)
PULMONARY  / CRITICAL CARE MEDICINE  Name: Tanner Wu MRN: [id number] DOB: 08/21/1921    ADMISSION DATE:  06/02/2013 CONSULTATION DATE:  9-30  REFERRING MD :  [facility] PRIMARY SERVICE: [facility]  CHIEF COMPLAINT:  VDRF  Brief Hx: 79yo male with COPD, chronic dysphagia, cervical fx after fall.  Post d/c from prolonged hospitalization had hypercarbic resp failure requiring intubation and subsequent tx to [facility]. Now s/p trach.    LINES / TUBES: OTT>> 9-27>>>10/2 Trach (JY) 10/2>>>  CULTURES: BCx2 10/5>>>  ANTIBIOTICS: Per im  VITAL SIGNS: Vital signs reviewed. Abnormal values will appear under impression plan section.   VENTILATOR SETTINGS:  PRVC 18, Tv 8, FiO2 35% and PEEP 5.  INTAKE / OUTPUT: Intake/Output   None    PHYSICAL EXAMINATION: General:  Frail  Chronically ill appearing male, NAD  Neuro:  Anxious but follows commands, trach in place. HEENT: trach c/d Cardiovascular:  HSIR IR Lungs: resps even non labored on full support,  Decrease bs bilat Abdomen:  +bs Musculoskeletal:  intact Skin:  Warm  Scant BLE edema   LABS:  CBC Recent Labs     06/08/13  0530  06/09/13  0500  WBC  13.3*  9.0  HGB  8.6*  7.4*  HCT  26.7*  22.3*  PLT  58*  55*   Coag's No results found for this basename: APTT, INR,  in the last 72 hours BMET Recent Labs     06/08/13  0530  06/09/13  0500  NA  139  131*  K  4.0  4.3  CL  105  99  CO2  25  23  BUN  45*  37*  CREATININE  0.67  0.55  GLUCOSE  106*  81   Electrolytes Recent Labs     06/08/13  0530  06/09/13  0500  CALCIUM  8.5  6.2*   Sepsis Markers No results found for this basename: LACTICACIDVEN, PROCALCITON, O2SATVEN,  in the last 72 hours ABG Recent Labs     06/08/13  1538  PHART  7.413  PCO2ART  41.5  PO2ART  113.0*   Liver Enzymes No results found for this basename: AST, ALT, ALKPHOS, BILITOT, ALBUMIN,  in the last 72 hours Cardiac Enzymes No results found for this basename: TROPONINI, PROBNP,  in the last  72 hours Glucose No results found for this basename: GLUCAP,  in the last 72 hours  Imaging Dg Chest Port 1 View  06/08/2013   CLINICAL DATA:  Respiratory failure.  EXAM: PORTABLE CHEST - 1 VIEW  COMPARISON:  06/05/2013 and 06/03/2013  FINDINGS: Right-sided pacemaker and tracheostomy tube unchanged. The lungs are somewhat hypoinflated with continued bibasilar opacification suggesting small effusions right worse than left likely with associated atelectasis. Cannot completely exclude infection in the lung bases. Moderate stable cardiomegaly. Remainder of the exam is unchanged.  IMPRESSION: Continued bibasilar opacification likely small effusions right worse than left likely with associated atelectasis. Cannot completely exclude infection in the lung bases.  Moderate stable cardiomegaly.   Electronically Signed   By: Elberta Fortis M.D.   On: 06/08/2013 07:18   ASSESSMENT / PLAN:  PULMONARY A:Chronic hypercarbic resp failure in setting of COPD, chronic aspiration, severe deconditioning and general ftt.  10/6 - failed wean last 2 days r/t tachypnea, poor effort P:   - cont attempts at weaning per protocol, PS 15-10 may be required - f/u CXR, no indication bronch - cont steroids and taper -appears can use lasix to neg 1 liter goals  daily, chem in am   CARDIOVASCULAR A: CHF. AFIB, CHF P:  - Per IM  NEUROLOGIC A:  Dementia, Anxiety  Disorder  P:   Limit sedation but control anxiety and depression Control anxiety  WHITEHEART,KATHRYN, NP 06/09/2013  9:31 AM Pager: ([phone] or [phone]  *Care during the described time interval was provided by me and/or other providers on the critical care team. I have reviewed this patient's available data, including medical history, events of note, physical examination and test results as part of my evaluation.  Mcarthur Rossetti. Tyson Alias, MD, FACP Pgr: [phone] [facility]

## 2013-06-10 LAB — CBC
HCT: 27.6 % — ABNORMAL LOW (ref 39.0–52.0)
Hemoglobin: 8.8 g/dL — ABNORMAL LOW (ref 13.0–17.0)
MCH: 26.8 pg (ref 26.0–34.0)
MCV: 84.1 fL (ref 78.0–100.0)
Platelets: 76 10*3/uL — ABNORMAL LOW (ref 150–400)
RBC: 3.28 MIL/uL — ABNORMAL LOW (ref 4.22–5.81)

## 2013-06-10 LAB — CULTURE, RESPIRATORY W GRAM STAIN

## 2013-06-10 LAB — BASIC METABOLIC PANEL
BUN: 43 mg/dL — ABNORMAL HIGH (ref 6–23)
CO2: 23 mEq/L (ref 19–32)
Calcium: 8.4 mg/dL (ref 8.4–10.5)
Chloride: 102 mEq/L (ref 96–112)
Creatinine, Ser: 0.61 mg/dL (ref 0.50–1.35)
Glucose, Bld: 79 mg/dL (ref 70–99)
Potassium: 4.7 mEq/L (ref 3.5–5.1)
Sodium: 136 mEq/L (ref 135–145)

## 2013-06-12 ENCOUNTER — Other Ambulatory Visit (HOSPITAL_COMMUNITY): Payer: Self-pay

## 2013-06-12 LAB — BASIC METABOLIC PANEL
CO2: 27 mEq/L (ref 19–32)
Calcium: 8.5 mg/dL (ref 8.4–10.5)
Creatinine, Ser: 0.68 mg/dL (ref 0.50–1.35)
GFR calc Af Amer: >90 mL/min (ref >90)
GFR calc non Af Amer: 82 mL/min — ABNORMAL LOW (ref >90)
Glucose, Bld: 91 mg/dL (ref 70–99)
Potassium: 4.4 mEq/L (ref 3.5–5.1)

## 2013-06-12 LAB — CBC
MCH: 27.5 pg (ref 26.0–34.0)
MCV: 84.9 fL (ref 78.0–100.0)
Platelets: 89 10*3/uL — ABNORMAL LOW (ref 150–400)
RBC: 3.24 MIL/uL — ABNORMAL LOW (ref 4.22–5.81)

## 2013-06-13 ENCOUNTER — Other Ambulatory Visit (HOSPITAL_COMMUNITY): Payer: Self-pay

## 2013-06-13 NOTE — Procedures (Signed)
Bronchoscopy Procedure Note - thiock secretions, failure wean, hypoxia on wean Tanner Wu [id number] Mar 06, 1921  Procedure: Bronchoscopy Indications: Diagnostic evaluation of the airways, Obtain specimens for culture and/or other diagnostic studies and Remove secretions  Procedure Details Consent: Risks of procedure as well as the alternatives and risks of each were explained to the (patient/caregiver).  Consent for procedure obtained. Time Out: Verified patient identification, verified procedure, site/side was marked, verified correct patient position, special equipment/implants available, medications/allergies/relevent history reviewed, required imaging and test results available.  Performed  In preparation for procedure, patient was given 100% FiO2 and bronchoscope lubricated. Sedation: Etomidate  Airway entered and the following bronchi were examined: RUL, RML, RLL, LUL, LLL and Bronchi.   Procedures performed: Brushings performed - none Bronchoscope removed.    Evaluation Hemodynamic Status: BP stable throughout; O2 sats: stable throughout Patient's Current Condition: stable Specimens:  Sent purulent fluid Complications: No apparent complications Patient did tolerate procedure well.   Nelda Bucks. 06/13/2013   1. Thick collapse pus white / yellow entire Bronchus interm , rll, sup rll, rml, all suctioned clear 2. Small white cap lesions at posterior orifice rll take off -look like suction trauma, BAl done for cytology 3. BAL rll, thick ous 4. Small partial collapse pus left upper division, suctionec clear 5. Mild bonrch trauma BO, spont resolved  Mcarthur Rossetti. Tyson Alias, MD, FACP Pgr: [phone] [facility]

## 2013-06-13 NOTE — Progress Notes (Signed)
PULMONARY  / CRITICAL CARE MEDICINE  Name: Tanner Wu MRN: [id number] DOB: 03-13-1921    ADMISSION DATE:  06/02/2013 CONSULTATION DATE:  9-30  REFERRING MD :  [facility] PRIMARY SERVICE: [facility]  CHIEF COMPLAINT:  VDRF  BRIEF SUMMARY:  79 y/o former copd with chronic dysphagia who went to nursing home late august for UTI. Fell and had cervical fx that was non displaced and treated with cervical collar. He was tx to [facility] and extubated and sent to rehab. DC home and was found to be hypercarbic and required reintubation and subsequent tx to [facility]. Daughter [facility]) had refused trach in past but is now receptive. PCCM asked to evaluate.    SIGNIFICANT EVENTS / STUDIES:  9/29 - Admit to [facility] 10/01 - Trach 10/10 - poor weaning efforts but minimal PEEP / FiO2 on full support  LINES / TUBES: OTT>> 9/27 Trach #6 10/1 (JY)>>>  CULTURES: BCx2 10/5>>> Sputum 10/10>>>  ANTIBIOTICS: Rocephin 10/4>>>  VITAL SIGNS: Vital signs reviewed. Abnormal values will appear under impression plan section.   VENTILATOR SETTINGS:  500 / 12 / 5 / 40%  PHYSICAL EXAMINATION: General:  Frail EWM Neuro:  Alert, follows commands, nods appropriately HEENT:  OTT-> vent Cardiovascular:  HSIR IR Lungs:  Decrease bs bilat Abdomen:  +bs Musculoskeletal:  intact Skin:  Warm ++edema  LABS:  CBC Recent Labs     06/12/13  0556  WBC  10.5  HGB  8.9*  HCT  27.5*  PLT  89*   Coag's No results found for this basename: APTT, INR,  in the last 72 hours BMET Recent Labs     06/12/13  0556  NA  138  K  4.4  CL  101  CO2  27  BUN  40*  CREATININE  0.68  GLUCOSE  91   Electrolytes Recent Labs     06/12/13  0556  CALCIUM  8.5   Imaging Dg Chest Port 1 View  06/12/2013   CLINICAL DATA:  Rest but very. Congestive heart failure.  EXAM: PORTABLE CHEST - 1 VIEW  COMPARISON:  06/08/2013  FINDINGS: Pacemaker introduced from the right with leads unchanged in position.  Prominent cardiomegaly.  Calcified markedly  tortuous aorta. Aneurysmal dilation not excluded.  Tracheostomy tube tip midline.  No gross pneumothorax.  Chronic lung changes with superimposed pulmonary vascular congestion. Consolidation lung bases unchanged this may represent a combination of elevated hemidiaphragm, atelectasis or infiltrate although mass not excluded.  IMPRESSION: No significant change. Please see above.   Electronically Signed   By: Bridgett Larsson M.D.   On: 06/12/2013 08:14     ASSESSMENT / PLAN:  PULMONARY A: Chronic hypercarbic resp failure in setting of COPD, chronic aspiration. P:   -full vent support with wean protocol, escalte PS -no ABG required -Consider treatment for depression but will defer to primary -Wean steroids as able -f/u cxr overweekend for ATX RLL noted -if continues to fail wean and secretions an issue would consider BRonch RLLL -Note on xopenex but carried allergy to xopenex -DNR -would not consider decannulation until C-Collar able to be removed and patient stronger overall  CARDIOVASCULAR A:  CHF AFIB P:  Per IM  NEUROLOGIC A:  S/P Cervical Fx - with C-Collar in place Dementia Anxiety  Disorder  P:   -PRN versed / fentanyl for sedation needs -recommend f/u with [facility] to determine how long C-Collar is needed   Canary Brim, NP-C [facility] Pgr: [phone] or [phone]  I have fully examined this patient and agree with above findings.    And edited infull  Mcarthur Rossetti. Tyson Alias, MD, FACP Pgr: [phone] [facility]

## 2013-06-14 ENCOUNTER — Other Ambulatory Visit (HOSPITAL_COMMUNITY): Payer: Self-pay

## 2013-06-14 LAB — CULTURE, RESPIRATORY

## 2013-06-14 LAB — CULTURE, BLOOD (ROUTINE X 2)
Culture: NO GROWTH
Culture: NO GROWTH

## 2013-06-14 LAB — BASIC METABOLIC PANEL
BUN: 35 mg/dL — ABNORMAL HIGH (ref 6–23)
Chloride: 99 mEq/L (ref 96–112)
Creatinine, Ser: 0.84 mg/dL (ref 0.50–1.35)
GFR calc non Af Amer: 75 mL/min — ABNORMAL LOW (ref >90)
Glucose, Bld: 90 mg/dL (ref 70–99)
Potassium: 3.9 mEq/L (ref 3.5–5.1)
Sodium: 140 mEq/L (ref 135–145)

## 2013-06-14 LAB — CBC
Hemoglobin: 9 g/dL — ABNORMAL LOW (ref 13.0–17.0)
MCHC: 32.1 g/dL (ref 30.0–36.0)
MCV: 85.6 fL (ref 78.0–100.0)
RBC: 3.27 MIL/uL — ABNORMAL LOW (ref 4.22–5.81)
WBC: 9 10*3/uL (ref 4.0–10.5)

## 2013-06-14 LAB — CULTURE, RESPIRATORY W GRAM STAIN

## 2013-06-15 LAB — BASIC METABOLIC PANEL
BUN: 40 mg/dL — ABNORMAL HIGH (ref 6–23)
CO2: 33 mEq/L — ABNORMAL HIGH (ref 19–32)
Chloride: 99 mEq/L (ref 96–112)
Creatinine, Ser: 0.85 mg/dL (ref 0.50–1.35)
GFR calc non Af Amer: 74 mL/min — ABNORMAL LOW (ref >90)
Glucose, Bld: 99 mg/dL (ref 70–99)
Sodium: 139 mEq/L (ref 135–145)

## 2013-06-15 LAB — CBC
HCT: 26.4 % — ABNORMAL LOW (ref 39.0–52.0)
Hemoglobin: 8.5 g/dL — ABNORMAL LOW (ref 13.0–17.0)
MCHC: 32.2 g/dL (ref 30.0–36.0)
MCV: 84.3 fL (ref 78.0–100.0)
Platelets: 185 10*3/uL (ref 150–400)
RBC: 3.13 MIL/uL — ABNORMAL LOW (ref 4.22–5.81)
WBC: 8.9 10*3/uL (ref 4.0–10.5)

## 2013-06-16 ENCOUNTER — Other Ambulatory Visit (HOSPITAL_COMMUNITY): Payer: Self-pay

## 2013-06-16 DIAGNOSIS — Z9911 Dependence on respirator [ventilator] status: Secondary | ICD-10-CM

## 2013-06-16 DIAGNOSIS — Z930 Tracheostomy status: Secondary | ICD-10-CM

## 2013-06-16 LAB — VANCOMYCIN, TROUGH: Vancomycin Tr: 18.1 ug/mL (ref 10.0–20.0)

## 2013-06-16 LAB — CULTURE, RESPIRATORY W GRAM STAIN

## 2013-06-16 LAB — CULTURE, RESPIRATORY

## 2013-06-16 LAB — HEPARIN INDUCED THROMBOCYTOPENIA PNL
Heparin Induced Plt Ab: POSITIVE
Patient O.D.: 0.934
UFH High Dose UFH H: 0 % Release
UFH Low Dose 0.5 IU/mL: 0 % Release

## 2013-06-16 LAB — CLOSTRIDIUM DIFFICILE BY PCR: Toxigenic C. Difficile by PCR: NEGATIVE

## 2013-06-16 NOTE — Progress Notes (Signed)
PULMONARY  / CRITICAL CARE MEDICINE  Name: Tanner Wu MRN: [id number] DOB: 1920/11/04    ADMISSION DATE:  06/02/2013 CONSULTATION DATE:  9-30  REFERRING MD :  [facility] PRIMARY SERVICE: [facility]  CHIEF COMPLAINT:  VDRF  BRIEF SUMMARY:  79 y/o former copd with chronic dysphagia who went to nursing home late august for UTI. Fell and had cervical fx that was non displaced and treated with cervical collar. He was tx to [facility], extubated and sent to rehab. DC home and was found to be hypercarbic and required reintubation and subsequent tx to [facility]. Daughter [facility]) had refused trach in past but is now receptive. PCCM asked to evaluate.    SIGNIFICANT EVENTS / STUDIES:  9/29 - Admit to [facility] 10/01 - Trach 10/10 - poor weaning efforts but minimal PEEP / FiO2 on full support 10/13 - cont poor weaning, minimal needs on full support, weans on PS 16-20, intermittent agitation   LINES / TUBES: OTT>> 9/27 Trach #6 10/1 (JY)>>>  CULTURES: BCx2 10/5>>>neg Sputum 10/10>>>neg  ANTIBIOTICS: Rocephin 10/4>>>  VITAL SIGNS: Vital signs reviewed. Abnormal values will appear under impression plan section.   VENTILATOR SETTINGS:  500 / 12 / 5 / 40%  PHYSICAL EXAMINATION: General:  Frail EWM Neuro:  RASS -1, intermittent agitation, just given PRN versed  HEENT:  c-collar, trach c/d Cardiovascular:  HSIR IR Lungs:  Decrease bs bilat Abdomen:  +bs Musculoskeletal:  intact Skin:  Warm ++edema  LABS:  CBC Recent Labs     06/14/13  0650  06/15/13  0535  WBC  9.0  8.9  HGB  9.0*  8.5*  HCT  28.0*  26.4*  PLT  153  185   Coag's No results found for this basename: APTT, INR,  in the last 72 hours BMET Recent Labs     06/14/13  0650  06/15/13  0535  NA  140  139  K  3.9  3.6  CL  99  99  CO2  33*  33*  BUN  35*  40*  CREATININE  0.84  0.85  GLUCOSE  90  99   Electrolytes Recent Labs     06/14/13  0650  06/15/13  0535  CALCIUM  8.8  9.2   Imaging Dg Chest Port 1 View  06/16/2013    CLINICAL DATA:  Respiratory failure.  EXAM: PORTABLE CHEST - 1 VIEW  COMPARISON:  06/14/2013  FINDINGS: The faint infiltrate at the left lung base has improved. Aeration at the right base has also improved. Chronic cardiomegaly with tortuosity of the thoracic aorta. Pacemaker and tracheostomy tube are in place and appear unchanged. PICC tip is in the right axilla, unchanged.  IMPRESSION: Improving left base infiltrate.   Electronically Signed   By: Geanie Cooley M.D.   On: 06/16/2013 07:15     ASSESSMENT / PLAN:  PULMONARY A: Chronic hypercarbic resp failure in setting of COPD, chronic aspiration. P:   -cont PS wean as tol - seems doubtful that he will ever be completely liberated given very poor weaning efforts in this debilitated 79yo -Consider treatment for depression but will defer to primary -Wean steroids to off  -BD -DNR  CARDIOVASCULAR A:  CHF AFIB P:  Per IM  NEUROLOGIC A:  S/P Cervical Fx - with C-Collar in place Dementia Anxiety  Disorder  P:   -PRN versed / fentanyl for sedation needs -recommend f/u with [facility] to determine how long C-Collar is needed  [facility], NP 06/16/2013  9:23 AM Pager: [phone] or [phone]  *Care during the described time interval was provided by me and/or other providers on the critical care team. I have reviewed this patient's available data, including medical history, events of note, physical examination and test results as part of my evaluation.   Billy Fischer, MD ; [facility] [phone].  After 5:30 PM or weekends, call [phone]

## 2013-06-17 ENCOUNTER — Other Ambulatory Visit (HOSPITAL_COMMUNITY): Payer: Self-pay

## 2013-06-17 LAB — BASIC METABOLIC PANEL
CO2: 34 mEq/L — ABNORMAL HIGH (ref 19–32)
Glucose, Bld: 92 mg/dL (ref 70–99)
Potassium: 3.5 mEq/L (ref 3.5–5.1)
Sodium: 143 mEq/L (ref 135–145)

## 2013-06-17 LAB — CBC
Hemoglobin: 8.6 g/dL — ABNORMAL LOW (ref 13.0–17.0)
MCHC: 31.2 g/dL (ref 30.0–36.0)
MCV: 87.1 fL (ref 78.0–100.0)
RBC: 3.17 MIL/uL — ABNORMAL LOW (ref 4.22–5.81)

## 2013-06-17 MED ORDER — IOHEXOL 300 MG/ML  SOLN
50.0000 mL | Freq: Once | INTRAMUSCULAR | Status: AC | PRN
  Administered 2013-06-17: 30 mL via INTRAVENOUS

## 2013-06-17 NOTE — Procedures (Signed)
Interventional Radiology Procedure Note  Procedure: 1.) Replacement for aq new 56F G-tube 2.) Exchange of right arm PICC for a new 7 cm PICC.  Tip is in teh right brachial vein and ready for use. 3.) Limited RUE venogram demonstrates occlusion of the right axillary vein with multiple venous collaterals.   Complications:  None Recommendations: - Routine line care - May use G-tube  Signed,  Sterling Big, MD [facility]

## 2013-06-19 ENCOUNTER — Other Ambulatory Visit (HOSPITAL_COMMUNITY): Payer: Self-pay

## 2013-06-19 LAB — CBC
Hemoglobin: 9.1 g/dL — ABNORMAL LOW (ref 13.0–17.0)
MCH: 27.2 pg (ref 26.0–34.0)
MCV: 87.4 fL (ref 78.0–100.0)
Platelets: 279 10*3/uL (ref 150–400)
RBC: 3.34 MIL/uL — ABNORMAL LOW (ref 4.22–5.81)

## 2013-06-19 LAB — BASIC METABOLIC PANEL
CO2: 30 mEq/L (ref 19–32)
Calcium: 8.8 mg/dL (ref 8.4–10.5)
Chloride: 102 mEq/L (ref 96–112)
Creatinine, Ser: 0.76 mg/dL (ref 0.50–1.35)
GFR calc Af Amer: >90 mL/min (ref >90)
Glucose, Bld: 111 mg/dL — ABNORMAL HIGH (ref 70–99)
Potassium: 4.1 mEq/L (ref 3.5–5.1)

## 2013-06-20 ENCOUNTER — Other Ambulatory Visit (HOSPITAL_COMMUNITY): Payer: Self-pay

## 2013-06-20 ENCOUNTER — Other Ambulatory Visit (HOSPITAL_COMMUNITY): Payer: MEDICARE

## 2013-06-20 DIAGNOSIS — Z930 Tracheostomy status: Secondary | ICD-10-CM

## 2013-06-20 DIAGNOSIS — J9600 Acute respiratory failure, unspecified whether with hypoxia or hypercapnia: Secondary | ICD-10-CM

## 2013-06-20 DIAGNOSIS — Z9911 Dependence on respirator [ventilator] status: Secondary | ICD-10-CM

## 2013-06-20 LAB — BASIC METABOLIC PANEL
CO2: 32 mEq/L (ref 19–32)
Calcium: 9.9 mg/dL (ref 8.4–10.5)
Creatinine, Ser: 0.86 mg/dL (ref 0.50–1.35)
GFR calc Af Amer: 86 mL/min — ABNORMAL LOW (ref >90)
GFR calc non Af Amer: 74 mL/min — ABNORMAL LOW (ref >90)
Glucose, Bld: 122 mg/dL — ABNORMAL HIGH (ref 70–99)
Sodium: 142 mEq/L (ref 135–145)

## 2013-06-20 LAB — HEPATIC FUNCTION PANEL
ALT: 18 U/L (ref 0–53)
Albumin: 2.6 g/dL — ABNORMAL LOW (ref 3.5–5.2)
Alkaline Phosphatase: 132 U/L — ABNORMAL HIGH (ref 39–117)
Bilirubin, Direct: 0.2 mg/dL (ref 0.0–0.3)
Indirect Bilirubin: 0.5 mg/dL (ref 0.3–0.9)
Total Protein: 7 g/dL (ref 6.0–8.3)

## 2013-06-20 LAB — CBC
HCT: 32 % — ABNORMAL LOW (ref 39.0–52.0)
MCHC: 30.9 g/dL (ref 30.0–36.0)
Platelets: 352 10*3/uL (ref 150–400)
RDW: 17.6 % — ABNORMAL HIGH (ref 11.5–15.5)
WBC: 12.3 10*3/uL — ABNORMAL HIGH (ref 4.0–10.5)

## 2013-06-20 LAB — AMYLASE: Amylase: 46 U/L (ref 0–105)

## 2013-06-20 LAB — DIGOXIN LEVEL: Digoxin Level: 1.4 ng/mL (ref 0.8–2.0)

## 2013-06-20 NOTE — Progress Notes (Signed)
PULMONARY  / CRITICAL CARE MEDICINE  Name: Tanner Wu MRN: [id number] DOB: October 30, 1920    ADMISSION DATE:  06/02/2013 CONSULTATION DATE:  9-30  REFERRING MD :  [facility] PRIMARY SERVICE: [facility]  CHIEF COMPLAINT:  VDRF  BRIEF SUMMARY:  79 y/o former copd with chronic dysphagia who went to nursing home late august for UTI. Fell and had cervical fx that was non displaced and treated with cervical collar. He was tx to [facility], extubated and sent to rehab. DC home and was found to be hypercarbic and required reintubation and subsequent tx to [facility]. Daughter Tanner Wu) had refused trach in past but is now receptive. PCCM asked to evaluate.    SIGNIFICANT EVENTS / STUDIES:  9/29 - Admit to [facility] 10/01 - Trach 10/10 - poor weaning efforts but minimal PEEP / FiO2 on full support 10/13 - cont poor weaning, minimal needs on full support, weans on PS 16-20, intermittent agitation   LINES / TUBES: OTT>> 9/27 Trach #6 10/1 (JY)>>>  CULTURES: BCx2 10/5>>>neg Sputum 10/10>>>neg  ANTIBIOTICS: Rocephin 10/4>>>  VITAL SIGNS: Vital signs reviewed. Abnormal values will appear under impression plan section.   SUBJ: Emesis this AM. Agitated. Making no progress weaning  PHYSICAL EXAMINATION: General:  Frail EWM Neuro:  RASS -1, intermittent agitation, just given PRN versed  HEENT:  c-collar, trach c/d Cardiovascular:  HSIR IR Lungs:  Scattered rhonchi Abdomen:  +bs Musculoskeletal:  intact Skin:  Warm ++edema  LABS:  CBC Recent Labs     06/19/13  0540  WBC  9.1  HGB  9.1*  HCT  29.2*  PLT  279   Coag's No results found for this basename: APTT, INR,  in the last 72 hours BMET Recent Labs     06/19/13  0540  NA  142  K  4.1  CL  102  CO2  30  BUN  48*  CREATININE  0.76  GLUCOSE  111*   Electrolytes Recent Labs     06/19/13  0540  CALCIUM  8.8   Imaging Dg Chest Port 1 View  06/20/2013   CLINICAL DATA:  Aspiration.  EXAM: PORTABLE CHEST - 1 VIEW  COMPARISON:  June 16, 2013.   FINDINGS: Stable cardiomegaly. Tracheostomy tube is unchanged in position. Right-sided pacemaker is unchanged in position. No acute pulmonary disease is noted. Stable blunting of left costophrenic sulcus is noted consistent with scarring. No pneumothorax is seen. Bony thorax appears normal.  IMPRESSION: No acute cardiopulmonary abnormality seen.   Electronically Signed   By: Roque Lias M.D.   On: 06/20/2013 09:02   Dg Abd Portable 1v  06/20/2013   CLINICAL DATA:  Abdominal pain  EXAM: PORTABLE ABDOMEN - 1 VIEW  COMPARISON:  June 19, 2013  FINDINGS: Gastrostomy catheter is positioned in the left upper quadrant. Bowel gas pattern is unremarkable. No obstruction or free air on this supine examination. There is a suprapubic catheter in the pelvis. There are multiple calcifications in the spleen consistent with granulomas.  IMPRESSION: Bowel gas pattern unremarkable.   Electronically Signed   By: Bretta Bang M.D.   On: 06/20/2013 09:02   Dg Abd Portable 1v  06/19/2013   CLINICAL DATA:  Ileus  EXAM: PORTABLE ABDOMEN - 1 VIEW  COMPARISON:  None.  FINDINGS: A supine film of the abdomen shows a gastrostomy tube to be present. No bowel distention is seen. There is no evidence of ileus. Mild cardiomegaly is present.  IMPRESSION: Nonspecific bowel gas pattern. No ileus. Gastrostomy.   Electronically  Signed   By: Dwyane Dee M.D.   On: 06/19/2013 14:36     ASSESSMENT / PLAN:  PULMONARY A: Chronic hypercarbic resp failure in setting of COPD, chronic aspiration. Difficult to wean - agitation contributing P:   Cont PS wean as tolerated Wean prednisone to off Cont bronchodilators  CARDIOVASCULAR A:  CHF AFIB P:  Per IM  NEUROLOGIC A:  S/P Cervical Fx - with C-Collar in place Dementia Anxiety  Disorder  P:   Cont current Rx Consider low dose long acting sedative such as clonazepam      Billy Fischer, MD ; [facility] service Mobile [phone].  After 5:30 PM or weekends, call  [phone]

## 2013-06-21 LAB — CBC
MCV: 87 fL (ref 78.0–100.0)
Platelets: 326 10*3/uL (ref 150–400)
RBC: 3.68 MIL/uL — ABNORMAL LOW (ref 4.22–5.81)
RDW: 17.7 % — ABNORMAL HIGH (ref 11.5–15.5)
WBC: 11.9 10*3/uL — ABNORMAL HIGH (ref 4.0–10.5)

## 2013-06-21 LAB — BASIC METABOLIC PANEL
BUN: 57 mg/dL — ABNORMAL HIGH (ref 6–23)
CO2: 29 mEq/L (ref 19–32)
Chloride: 104 mEq/L (ref 96–112)
Creatinine, Ser: 0.94 mg/dL (ref 0.50–1.35)
GFR calc Af Amer: 83 mL/min — ABNORMAL LOW (ref >90)
Potassium: 3.8 mEq/L (ref 3.5–5.1)
Sodium: 143 mEq/L (ref 135–145)

## 2013-06-22 ENCOUNTER — Other Ambulatory Visit (HOSPITAL_COMMUNITY): Payer: Self-pay

## 2013-06-22 ENCOUNTER — Encounter: Payer: Self-pay | Admitting: Radiology

## 2013-06-22 LAB — CBC WITH DIFFERENTIAL/PLATELET
Basophils Absolute: 0.1 10*3/uL (ref 0.0–0.1)
Eosinophils Absolute: 0.3 10*3/uL (ref 0.0–0.7)
HCT: 30.6 % — ABNORMAL LOW (ref 39.0–52.0)
Hemoglobin: 9.4 g/dL — ABNORMAL LOW (ref 13.0–17.0)
Lymphocytes Relative: 10 % — ABNORMAL LOW (ref 12–46)
Lymphs Abs: 1.2 10*3/uL (ref 0.7–4.0)
MCHC: 30.7 g/dL (ref 30.0–36.0)
MCV: 88.4 fL (ref 78.0–100.0)
Monocytes Absolute: 1.2 10*3/uL — ABNORMAL HIGH (ref 0.1–1.0)
Monocytes Relative: 9 % (ref 3–12)
Neutro Abs: 9.8 10*3/uL — ABNORMAL HIGH (ref 1.7–7.7)
RBC: 3.46 MIL/uL — ABNORMAL LOW (ref 4.22–5.81)
RDW: 18.2 % — ABNORMAL HIGH (ref 11.5–15.5)
WBC: 12.5 10*3/uL — ABNORMAL HIGH (ref 4.0–10.5)

## 2013-06-22 LAB — BASIC METABOLIC PANEL
CO2: 31 mEq/L (ref 19–32)
Chloride: 106 mEq/L (ref 96–112)
Creatinine, Ser: 0.89 mg/dL (ref 0.50–1.35)
Glucose, Bld: 122 mg/dL — ABNORMAL HIGH (ref 70–99)
Sodium: 146 mEq/L — ABNORMAL HIGH (ref 135–145)

## 2013-06-22 LAB — LIPASE, BLOOD: Lipase: 14 U/L (ref 11–59)

## 2013-06-22 MED ORDER — IOHEXOL 350 MG/ML SOLN: 100.0000 mL | Freq: Once | INTRAVENOUS | Status: AC | PRN

## 2013-06-23 ENCOUNTER — Encounter: Payer: Self-pay | Admitting: Radiology

## 2013-06-23 DIAGNOSIS — R4182 Altered mental status, unspecified: Secondary | ICD-10-CM

## 2013-06-23 DIAGNOSIS — R0902 Hypoxemia: Secondary | ICD-10-CM

## 2013-06-23 LAB — CBC
HCT: 29.5 % — ABNORMAL LOW (ref 39.0–52.0)
Hemoglobin: 9.3 g/dL — ABNORMAL LOW (ref 13.0–17.0)
MCH: 27.4 pg (ref 26.0–34.0)
MCV: 87 fL (ref 78.0–100.0)
RBC: 3.39 MIL/uL — ABNORMAL LOW (ref 4.22–5.81)
WBC: 12.9 10*3/uL — ABNORMAL HIGH (ref 4.0–10.5)

## 2013-06-23 LAB — BASIC METABOLIC PANEL
CO2: 28 mEq/L (ref 19–32)
Calcium: 9.1 mg/dL (ref 8.4–10.5)
Chloride: 103 mEq/L (ref 96–112)
GFR calc Af Amer: 85 mL/min — ABNORMAL LOW (ref >90)
Glucose, Bld: 98 mg/dL (ref 70–99)
Sodium: 140 mEq/L (ref 135–145)

## 2013-06-23 MED ORDER — IOHEXOL 350 MG/ML SOLN
100.0000 mL | Freq: Once | INTRAVENOUS | Status: AC | PRN
  Administered 2013-06-23: 100 mL via INTRAVENOUS

## 2013-06-23 NOTE — Progress Notes (Signed)
PULMONARY  / CRITICAL CARE MEDICINE  Name: Giovanie Lefebre MRN: [id number] DOB: 1920-10-22    ADMISSION DATE:  06/02/2013 CONSULTATION DATE:  9-30  REFERRING MD :  [facility] PRIMARY SERVICE: [facility]  CHIEF COMPLAINT:  VDRF  BRIEF SUMMARY:  79 y/o former copd with chronic dysphagia who went to nursing home late august for UTI. Fell and had cervical fx that was non displaced and treated with cervical collar. He was tx to [facility], extubated and sent to rehab. DC home and was found to be hypercarbic and required reintubation and subsequent tx to [facility]. Daughter [facility]) had refused trach in past but is now receptive. PCCM asked to evaluate.    SIGNIFICANT EVENTS / STUDIES:  9/29 - Admit to [facility] 10/01 - Trach 10/10 - poor weaning efforts but minimal PEEP / FiO2 on full support 10/13 - cont poor weaning, minimal needs on full support, weans on PS 16-20, intermittent agitation 10/20- improved PS needs slight on weaning 10/20 - ct chest>> 6.6 cm ascending thoracic aortic aneurysm.<BR> <BR>2. Nonocclusive thrombus in the distal abdominal aorta and extending<BR>into the left iliac and external iliac arteries. Occlusive thrombus<BR>demonstrated in the left common femoral and external iliac arteries.<BR> <BR>3. Appearance of severe stenosis of the left renal artery consistent<BR>with atherosclerotic change.<BR> <BR>4. Limited visualization right lower quadrant due to motion<BR>artifact. Inflammatory changes are suggested.<BR>   LINES / TUBES: OTT>> 9/27 Trach #6 10/1 (JY)>>>  CULTURES: BCx2 10/5>>>neg Sputum 10/10>>>neg  ANTIBIOTICS: Rocephin 10/4>>>  VITAL SIGNS: Vital signs reviewed. Abnormal values will appear under impression plan section.   SUBJ: PS to 16  PHYSICAL EXAMINATION: General:  Frail EWM Neuro:  RASS -1, nonfocal  HEENT:  c-collar, trach c/d Cardiovascular:  HSIR IR Lungs:  Slight coarse Abdomen:  +bs, no r/g Musculoskeletal:  intact Skin:  Warm ++edema  LABS:  CBC Recent Labs   06/21/13  1300  06/22/13  0430  06/23/13  0527  WBC  11.9*  12.5*  12.9*  HGB  10.0*  9.4*  9.3*  HCT  32.0*  30.6*  29.5*  PLT  326  289  265   Coag's No results found for this basename: APTT, INR,  in the last 72 hours BMET Recent Labs     06/21/13  1648  06/22/13  0830  06/23/13  0527  NA  143  146*  140  K  3.8  3.3*  3.8  CL  104  106  103  CO2  29  31  28   BUN  57*  55*  42*  CREATININE  0.94  0.89  0.87  GLUCOSE  119*  122*  98   Electrolytes Recent Labs     06/21/13  1648  06/22/13  0830  06/23/13  0527  CALCIUM  9.0  9.1  9.1  MG   --    --   2.2   Imaging Ct Angio Abd/pel W/ And/or W/o  06/23/2013   CLINICAL DATA:  Abdominal pain and ileus.  EXAM: CT ANGIOGRAPHY ABDOMEN AND PELVIS WITH CONTRAST AND WITHOUT CONTRAST  TECHNIQUE: Multidetector CT imaging of the abdomen and pelvis was performed using the standard protocol during bolus administration of intravenous contrast. Multiplanar reconstructed images including MIPs were obtained and reviewed to evaluate the vascular anatomy.  CONTRAST:  OMNIPAQUE IOHEXOL 350 MG/ML SOLN  COMPARISON:  Abdomen 06/20/2013  FINDINGS: Emphysematous changes and fibrosis in the lung bases. Focal consolidation in the right lower lung likely to represent pneumonia. There is prominent dilatation of the  ascending thoracic aorta, with AP diameter measured at 6.6 cm. Focal mural thrombus and calcification demonstrated in the aorta.  The abdominal aorta is tortuous with normal caliber. There is diffuse calcific and noncalcific atherosclerotic change. There is eccentric nonocclusive thrombus in the inferior aspect of the abdominal aorta extending into the left iliac artery with diffuse thrombosis of the left common femoral artery and left external iliac artery. There is atherosclerotic disease in the major branch vessels of the abdominal aorta. The celiac axis, superior mesenteric artery, and bilateral renal arteries appear patent although  there is evidence of severe disease in the left renal artery with limited perfusion demonstrated. The renal nephrogram appears slightly delayed on the left. The right common and external iliac arteries and the right femoral artery appear patent. The inferior mesenteric artery is diminutive but appears patent.  Calcified granulomas in the spleen. The liver, gallbladder, pancreas, adrenal glands, inferior vena cava, and retroperitoneal lymph nodes are unremarkable. Bilateral renal cysts, largest in the lower pole of the left kidney. No hydronephrosis in either kidney. No free air or free fluid in the abdomen. There is a gastrostomy 2 positioned in the stomach. The stomach, small bowel, and colon are decompressed. Bowel cannot be evaluated for wall thickness due to lack of contrast material, decompression, and motion artifact.  Pelvis: Suprapubic catheter in the bladder. Small amount of gas in the bladder is consistent with catheter placement. Calcification in the prostate gland without enlargement. Fluid filled rectosigmoid colon without distention. Sigmoid diverticula without diverticulitis. The appendix is not identified. There is suggestion of inflammatory infiltration or fluid in the right lower quadrant but this distention is limited due to motion artifact. Inflammatory process is not excluded. Diffuse degenerative change throughout the lumbar spine. Degenerative changes in the hips. No destructive bone lesions appreciated.  Review of the MIP images confirms the above findings.  IMPRESSION: 1.  6.6 cm ascending thoracic aortic aneurysm.  2. Nonocclusive thrombus in the distal abdominal aorta and extending into the left iliac and external iliac arteries. Occlusive thrombus demonstrated in the left common femoral and external iliac arteries.  3. Appearance of severe stenosis of the left renal artery consistent with atherosclerotic change.  4. Limited visualization right lower quadrant due to motion artifact.  Inflammatory changes are suggested.   Electronically Signed   By: Burman Nieves M.D.   On: 06/23/2013 00:58     ASSESSMENT / PLAN:  PULMONARY A: Chronic hypercarbic resp failure in setting of COPD, chronic aspiration. Difficult to wean - overall  P:   Continue PS to goal 14 if able, goal 4-6 hrs Slow progress from PS 22-20 last week overall I feel poor prognosis for liberation CT reviewed, per [facility] Would re assess pcxr this week  Mcarthur Rossetti. Tyson Alias, MD, FACP Pgr: [phone] [place]

## 2013-06-24 LAB — CBC
HCT: 28.8 % — ABNORMAL LOW (ref 39.0–52.0)
Hemoglobin: 8.9 g/dL — ABNORMAL LOW (ref 13.0–17.0)
RBC: 3.26 MIL/uL — ABNORMAL LOW (ref 4.22–5.81)
RDW: 17.9 % — ABNORMAL HIGH (ref 11.5–15.5)
WBC: 12.1 10*3/uL — ABNORMAL HIGH (ref 4.0–10.5)

## 2013-06-24 LAB — PROTIME-INR: INR: 1.12 (ref 0.00–1.49)

## 2013-06-25 LAB — BASIC METABOLIC PANEL
BUN: 41 mg/dL — ABNORMAL HIGH (ref 6–23)
Chloride: 107 mEq/L (ref 96–112)
GFR calc Af Amer: 85 mL/min — ABNORMAL LOW (ref >90)
Potassium: 3.4 mEq/L — ABNORMAL LOW (ref 3.5–5.1)

## 2013-06-25 LAB — PROTIME-INR
INR: 1.74 — ABNORMAL HIGH (ref 0.00–1.49)
Prothrombin Time: 19.8 seconds — ABNORMAL HIGH (ref 11.6–15.2)

## 2013-06-25 LAB — CBC
HCT: 29.9 % — ABNORMAL LOW (ref 39.0–52.0)
Hemoglobin: 9.2 g/dL — ABNORMAL LOW (ref 13.0–17.0)
Platelets: 205 10*3/uL (ref 150–400)
RBC: 3.38 MIL/uL — ABNORMAL LOW (ref 4.22–5.81)
RDW: 18.2 % — ABNORMAL HIGH (ref 11.5–15.5)
WBC: 9.9 10*3/uL (ref 4.0–10.5)

## 2013-06-25 NOTE — Progress Notes (Signed)
PULMONARY  / CRITICAL CARE MEDICINE  Name: Tanner Wu MRN: [id number] DOB: 12-19-1920    ADMISSION DATE:  06/02/2013 CONSULTATION DATE:  9-30  REFERRING MD :  [facility] PRIMARY SERVICE: [facility]  CHIEF COMPLAINT:  VDRF  BRIEF SUMMARY:   79 yo male with hx of COPD s/p cervical spine fx August 2014, and tx at [facility].  Developed hypercapnic respiratory failure requiring mechanical ventilation after hospital d/c.  Transfer to [facility] for vent weaning.  PCCM consulted to assist with vent management.  SIGNIFICANT EVENTS:  9/29 - Admit to [facility] 10/01 - Trach 10/14 - G tube  STUDIES: 10/03 - Echo >> EF 50 to 55%, mild AI 10/20 - CT abd/pelvis >> 6.6 cm TAA, non occlusive thrombus in distal AA extending to Lt iliac artery, occlusive thrombus Lt common femoral and Lt external iliac arteries, severe stenosis Lt renal artery   LINES / TUBES: OTT>> 9/27 Trach #6 10/1 (JY) >>>  SUBJECTIVE: Not able to tolerate pressure support.  VITAL SIGNS: RR 16, HR 62, BP 107/56, SaO2 100%, Temp 97.6 RR 12, Vt 500, FiO2 35%, PEEP 5  PHYSICAL EXAMINATION: General:  Frail EWM Neuro:  RASS -1, nonfocal  HEENT:  c-collar, trach c/d Cardiovascular:  HSIR IR Lungs:  Slight coarse Abdomen:  +bs, no r/g Musculoskeletal:  intact Skin:  Warm ++edema  LABS:  CBC Recent Labs     06/23/13  0527  06/24/13  0540  06/25/13  0555  WBC  12.9*  12.1*  9.9  HGB  9.3*  8.9*  9.2*  HCT  29.5*  28.8*  29.9*  PLT  265  229  205   Coag's Recent Labs     06/24/13  0540  06/25/13  0555  INR  1.12  1.74*   BMET Recent Labs     06/23/13  0527  06/25/13  0555  NA  140  143  K  3.8  3.4*  CL  103  107  CO2  28  29  BUN  42*  41*  CREATININE  0.87  0.89  GLUCOSE  98  76   Electrolytes Recent Labs     06/23/13  0527  06/25/13  0555  CALCIUM  9.1  8.7  MG  2.2   --    Imaging No results found.   ASSESSMENT / PLAN:   A: Acute on chronic respiratory failure 2nd to COPD, chronic aspiration. S/p  tracheostomy. P:   -pressure support wean as tolerated >> not making progress -f/u CXR 10/27 -continue budesonide, atrovent, xopenex -negative fluid balance as tolerated >> lasix per primary team  A: Peripheral arterial disease with thrombus in TAA down to iliacs. P: -per primary team  A: S/p C spine fx. P: -continue C collar  Brett Canales Minor ACNP [facility] PCCM Pager [phone] till 3 pm If no answer page [phone] 06/25/2013, 10:20 AM  Reviewed above, examined pt, and documentation changes made as needed.  He is not making progress with vent weaning.  Not sure what can be done for his peripheral arterial disease.  He is DNR.  Primary team to continue discussions with family about goals of care.  PCCM will f/u on Monday 10/27 >> call if help needed sooner.  Coralyn Helling, MD [facility] Pulmonary/Critical Care 06/25/2013, 11:24 AM Pager:  [phone] After 3pm call: [phone]

## 2013-06-26 LAB — PROTIME-INR: Prothrombin Time: 29.2 seconds — ABNORMAL HIGH (ref 11.6–15.2)

## 2013-06-27 ENCOUNTER — Other Ambulatory Visit (HOSPITAL_COMMUNITY): Payer: Self-pay

## 2013-06-27 LAB — BASIC METABOLIC PANEL
BUN: 38 mg/dL — ABNORMAL HIGH (ref 6–23)
CO2: 29 mEq/L (ref 19–32)
Calcium: 9.1 mg/dL (ref 8.4–10.5)
Chloride: 105 mEq/L (ref 96–112)
Creatinine, Ser: 0.8 mg/dL (ref 0.50–1.35)
GFR calc Af Amer: 88 mL/min — ABNORMAL LOW (ref >90)
Sodium: 142 mEq/L (ref 135–145)

## 2013-06-27 LAB — CBC
HCT: 30.1 % — ABNORMAL LOW (ref 39.0–52.0)
MCH: 27.9 pg (ref 26.0–34.0)
MCHC: 31.6 g/dL (ref 30.0–36.0)
Platelets: 200 10*3/uL (ref 150–400)
RBC: 3.4 MIL/uL — ABNORMAL LOW (ref 4.22–5.81)
RDW: 17.7 % — ABNORMAL HIGH (ref 11.5–15.5)
WBC: 11.2 10*3/uL — ABNORMAL HIGH (ref 4.0–10.5)

## 2013-06-27 LAB — PROTIME-INR: Prothrombin Time: 33.7 seconds — ABNORMAL HIGH (ref 11.6–15.2)

## 2013-06-28 LAB — PROTIME-INR
INR: 3.51 — ABNORMAL HIGH (ref 0.00–1.49)
Prothrombin Time: 33.9 seconds — ABNORMAL HIGH (ref 11.6–15.2)

## 2013-06-29 LAB — PROTIME-INR: INR: 3.56 — ABNORMAL HIGH (ref 0.00–1.49)

## 2013-06-30 ENCOUNTER — Other Ambulatory Visit (HOSPITAL_COMMUNITY): Payer: Self-pay

## 2013-06-30 LAB — BASIC METABOLIC PANEL
BUN: 49 mg/dL — ABNORMAL HIGH (ref 6–23)
CO2: 31 mEq/L (ref 19–32)
Calcium: 9.2 mg/dL (ref 8.4–10.5)
Chloride: 101 mEq/L (ref 96–112)
Creatinine, Ser: 0.88 mg/dL (ref 0.50–1.35)
Glucose, Bld: 94 mg/dL (ref 70–99)

## 2013-06-30 LAB — CBC
HCT: 31.1 % — ABNORMAL LOW (ref 39.0–52.0)
MCH: 27.8 pg (ref 26.0–34.0)
MCV: 88.4 fL (ref 78.0–100.0)
Platelets: 221 10*3/uL (ref 150–400)
RBC: 3.52 MIL/uL — ABNORMAL LOW (ref 4.22–5.81)
WBC: 12.8 10*3/uL — ABNORMAL HIGH (ref 4.0–10.5)

## 2013-07-01 LAB — PROTIME-INR: Prothrombin Time: 30.4 seconds — ABNORMAL HIGH (ref 11.6–15.2)

## 2013-07-01 NOTE — Progress Notes (Signed)
PULMONARY  / CRITICAL CARE MEDICINE  Name: Shaheer Bonfield MRN: [id number] DOB: 1921-02-28    ADMISSION DATE:  06/02/2013 CONSULTATION DATE:  9-30  REFERRING MD :  [facility] PRIMARY SERVICE: [facility]  CHIEF COMPLAINT:  VDRF  BRIEF SUMMARY:   79 yo male with hx of COPD s/p cervical spine fx August 2014, and tx at [facility].  Developed hypercapnic respiratory failure requiring mechanical ventilation after hospital d/c.  Transfer to [facility] for vent weaning.  PCCM consulted to assist with vent management.  SIGNIFICANT EVENTS:  9/29 - Admit to [facility] 10/01 - Trach 10/14 - G tube  STUDIES: 10/03 - Echo >> EF 50 to 55%, mild AI 10/20 - CT abd/pelvis >> 6.6 cm TAA, non occlusive thrombus in distal AA extending to Lt iliac artery, occlusive thrombus Lt common femoral and Lt external iliac arteries, severe stenosis Lt renal artery   LINES / TUBES: OTT>> 9/27 Trach #6 10/1 (JY) >>>  SUBJECTIVE: Tolerates pressure support 20 >> low Vt and increase RR with attempts at lower support.  Family looking into home vent set up.  VITAL SIGNS: Reviewed in bedside chart.  PHYSICAL EXAMINATION: General: no distress Neuro: sleepy HEENT:  c-collar, trach site clean Cardiovascular: irregular Lungs: no wheeze Abdomen: soft, non tender Musculoskeletal:  intact Skin: multiple ecchymoses  LABS:  CBC Recent Labs     06/30/13  0540  WBC  12.8*  HGB  9.8*  HCT  31.1*  PLT  221   Coag's Recent Labs     06/29/13  0545  06/30/13  0540  07/01/13  0600  INR  3.56*  3.51*  3.04*   BMET Recent Labs     06/30/13  0540  NA  141  K  4.4  CL  101  CO2  31  BUN  49*  CREATININE  0.88  GLUCOSE  94   Electrolytes Recent Labs     06/30/13  0540  CALCIUM  9.2   Imaging Dg Chest Port 1 View  06/30/2013   CLINICAL DATA:  Acute respiratory failure. Hypoxemia.  Tracheostomy.  EXAM: PORTABLE CHEST - 1 VIEW  COMPARISON:  06/27/2013  FINDINGS: Cardiomegaly remains stable. Chronic bibasilar interstitial prominence  again noted, however there is no evidence of acute infiltrate or edema. Minimal left basilar pleural thickening versus pleural effusion again noted. Ectasia of the thoracic aorta is stable. Single lead transvenous pacemaker and tracheostomy tube remain in place.  IMPRESSION: Stable cardiomegaly and chronic bibasilar interstitial prominence. No acute findings.   Electronically Signed   By: Myles Rosenthal M.D.   On: 06/30/2013 07:49     ASSESSMENT / PLAN:   A: Acute on chronic respiratory failure 2nd to COPD, chronic aspiration. S/p tracheostomy. P:   -pressure support wean as tolerated >> not making progress beyond pressure support 20 -f/u CXR intermittently -continue budesonide, atrovent, xopenex -negative fluid balance as tolerated  A: Peripheral arterial disease with thrombus in TAA down to iliacs. P: -per primary team  A: S/p C spine fx. P: -continue C collar  Goals of care >> DNR.  Summary: Family is looking into arranging for home vent set up.  They need to ensure arrangements have been made for pulmonary follow up as outpt closer to where they live.  PCCM will f/u next week >> call if help needed sooner.  Coralyn Helling, MD [facility] Pulmonary/Critical Care 07/01/2013, 10:25 AM Pager:  [phone] After 3pm call: [phone]

## 2013-07-02 ENCOUNTER — Other Ambulatory Visit (HOSPITAL_COMMUNITY): Payer: Self-pay

## 2013-07-02 LAB — CBC
HCT: 29.1 % — ABNORMAL LOW (ref 39.0–52.0)
Hemoglobin: 9.3 g/dL — ABNORMAL LOW (ref 13.0–17.0)
MCV: 86.6 fL (ref 78.0–100.0)
RBC: 3.36 MIL/uL — ABNORMAL LOW (ref 4.22–5.81)
RDW: 17 % — ABNORMAL HIGH (ref 11.5–15.5)
WBC: 10.7 10*3/uL — ABNORMAL HIGH (ref 4.0–10.5)

## 2013-07-02 LAB — BASIC METABOLIC PANEL
BUN: 48 mg/dL — ABNORMAL HIGH (ref 6–23)
CO2: 29 mEq/L (ref 19–32)
Chloride: 102 mEq/L (ref 96–112)
Creatinine, Ser: 0.83 mg/dL (ref 0.50–1.35)
GFR calc Af Amer: 87 mL/min — ABNORMAL LOW (ref >90)
Potassium: 5 mEq/L (ref 3.5–5.1)
Sodium: 139 mEq/L (ref 135–145)

## 2013-07-02 LAB — PROTIME-INR: INR: 2.81 — ABNORMAL HIGH (ref 0.00–1.49)

## 2013-07-03 LAB — BASIC METABOLIC PANEL
Calcium: 9.2 mg/dL (ref 8.4–10.5)
Chloride: 103 mEq/L (ref 96–112)
GFR calc Af Amer: 88 mL/min — ABNORMAL LOW (ref >90)
GFR calc non Af Amer: 76 mL/min — ABNORMAL LOW (ref >90)
Glucose, Bld: 110 mg/dL — ABNORMAL HIGH (ref 70–99)
Potassium: 4.4 mEq/L (ref 3.5–5.1)
Sodium: 141 mEq/L (ref 135–145)

## 2013-07-04 ENCOUNTER — Other Ambulatory Visit (HOSPITAL_COMMUNITY): Payer: Self-pay

## 2013-07-04 LAB — BLOOD GAS, ARTERIAL
Acid-Base Excess: 5.6 mmol/L — ABNORMAL HIGH (ref 0.0–2.0)
FIO2: 30 %
MECHVT: 500 mL
O2 Saturation: 99.2 %
PEEP: 5 cmH2O
Patient temperature: 98.6
RATE: 12 resp/min

## 2013-07-04 LAB — PROCALCITONIN: Procalcitonin: <0.1 ng/mL

## 2013-07-04 LAB — CBC
HCT: 31.9 % — ABNORMAL LOW (ref 39.0–52.0)
Hemoglobin: 9.6 g/dL — ABNORMAL LOW (ref 13.0–17.0)
MCHC: 30.2 g/dL (ref 30.0–36.0)
MCV: 89.4 fL (ref 78.0–100.0)
Platelets: 218 10*3/uL (ref 150–400)
RBC: 3.57 MIL/uL — ABNORMAL LOW (ref 4.22–5.81)
RBC: 3.36 MIL/uL — ABNORMAL LOW (ref 4.22–5.81)
RDW: 16.8 % — ABNORMAL HIGH (ref 11.5–15.5)
RDW: 16.7 % — ABNORMAL HIGH (ref 11.5–15.5)
WBC: 13 10*3/uL — ABNORMAL HIGH (ref 4.0–10.5)
WBC: 11.7 10*3/uL — ABNORMAL HIGH (ref 4.0–10.5)

## 2013-07-04 LAB — BASIC METABOLIC PANEL
CO2: 30 mEq/L (ref 19–32)
CO2: 30 mEq/L (ref 19–32)
Calcium: 9.2 mg/dL (ref 8.4–10.5)
Calcium: 9 mg/dL (ref 8.4–10.5)
Chloride: 106 mEq/L (ref 96–112)
Chloride: 105 mEq/L (ref 96–112)
Creatinine, Ser: 0.75 mg/dL (ref 0.50–1.35)
Creatinine, Ser: 0.74 mg/dL (ref 0.50–1.35)
GFR calc Af Amer: >90 mL/min (ref >90)
GFR calc Af Amer: >90 mL/min (ref >90)
GFR calc non Af Amer: 79 mL/min — ABNORMAL LOW (ref >90)
Glucose, Bld: 106 mg/dL — ABNORMAL HIGH (ref 70–99)
Potassium: 4.6 mEq/L (ref 3.5–5.1)
Sodium: 143 mEq/L (ref 135–145)

## 2013-07-04 LAB — PROTIME-INR: INR: 2.67 — ABNORMAL HIGH (ref 0.00–1.49)

## 2013-07-04 NOTE — Progress Notes (Signed)
PULMONARY  / CRITICAL CARE MEDICINE  Name: Tanner Wu MRN: [id number] DOB: [birth year]    ADMISSION DATE:  06/02/2013 CONSULTATION DATE:  9-30  REFERRING MD :  [facility] PRIMARY SERVICE: [facility]  CHIEF COMPLAINT:  VDRF  BRIEF SUMMARY:   79 yo male with hx of COPD s/p cervical spine fx August 2014, and tx at [facility].  Developed hypercapnic respiratory failure requiring mechanical ventilation after hospital d/c.  Transfer to [facility] for vent weaning.  PCCM consulted to assist with vent management.  SIGNIFICANT EVENTS:  9/29 - Admit to [facility] 10/01 - Trach 10/14 - G tube  STUDIES: 10/03 - Echo >> EF 50 to 55%, mild AI 10/20 - CT abd/pelvis >> 6.6 cm TAA, non occlusive thrombus in distal AA extending to Lt iliac artery, occlusive thrombus Lt common femoral and Lt external iliac arteries, severe stenosis Lt renal artery   LINES / TUBES: OTT>> 9/27 Trach #6 10/1 (JY) >>>  SUBJECTIVE: Tolerates PS 12 over PEEP 5  VITAL SIGNS: Reviewed in bedside chart.  PHYSICAL EXAMINATION: General: no distress Neuro: sleepy HEENT:  c-collar, trach site clean Cardiovascular: irregular Lungs: no wheeze Abdomen: soft, non tender Musculoskeletal:  intact Skin: multiple ecchymoses  LABS:  CBC Recent Labs     07/02/13  0645  07/04/13  0600  WBC  10.7*  13.0*  HGB  9.3*  9.6*  HCT  29.1*  31.9*  PLT  223  218   Coag's Recent Labs     07/02/13  0645  07/03/13  0530  07/04/13  0600  INR  2.81*  2.66*  2.67*   BMET Recent Labs     07/02/13  0645  07/03/13  0530  07/04/13  0600  NA  139  141  145  K  5.0  4.4  4.6  CL  102  103  106  CO2  29  30  30   BUN  48*  46*  48*  CREATININE  0.83  0.81  0.75  GLUCOSE  87  110*  106*   Electrolytes Recent Labs     07/02/13  0645  07/03/13  0530  07/04/13  0600  CALCIUM  9.3  9.2  9.2   Imaging Dg Chest Port 1 View  07/04/2013   CLINICAL DATA:  Ventilator dependent respiratory failure.  EXAM: PORTABLE CHEST - 1 VIEW  COMPARISON:  07/02/2013   FINDINGS: Single lead pacer is unchanged. Tracheostomy is appropriately positioned.  Cardiomegaly with a tortuous thoracic aorta. Mild left costophrenic angle blunting is chronic and favored to represent pleural fluid. No pneumothorax. Similar left greater than right bibasilar opacities.  IMPRESSION: No significant change since the prior exam.  Cardiomegaly with trace left-sided pleural thickening versus less likely fluid.  Similar bibasilar opacities, favored to represent areas of scarring and atelectasis.   Electronically Signed   By: Jeronimo Greaves M.D.   On: 07/04/2013 07:59     ASSESSMENT / PLAN:   A: Acute on chronic respiratory failure 2nd to COPD, chronic aspiration. S/p tracheostomy. P:   -pressure support wean as tolerated -f/u CXR intermittently -continue budesonide, atrovent, xopenex -negative fluid balance as tolerated  A: Peripheral arterial disease with thrombus in TAA down to iliacs. P: -per primary team  A: S/p C spine fx. P: -continue C collar  Goals of care >> DNR.  Summary: Family is looking into arranging for home vent set up.  They need to ensure arrangements have been made for pulmonary follow up as outpt closer to  where they live.  PCCM will f/u next week >> call if help needed sooner.  Coralyn Helling, MD [facility] Pulmonary/Critical Care 07/04/2013, 10:24 AM Pager:  [phone] After 3pm call: [phone]

## 2013-07-05 LAB — URINE CULTURE
Colony Count: NO GROWTH
Culture: NO GROWTH

## 2013-07-06 DIAGNOSIS — M7989 Other specified soft tissue disorders: Secondary | ICD-10-CM

## 2013-07-06 LAB — BASIC METABOLIC PANEL
BUN: 36 mg/dL — ABNORMAL HIGH (ref 6–23)
CO2: 29 mEq/L (ref 19–32)
Calcium: 8.8 mg/dL (ref 8.4–10.5)
Chloride: 107 mEq/L (ref 96–112)
Glucose, Bld: 106 mg/dL — ABNORMAL HIGH (ref 70–99)
Potassium: 4.2 mEq/L (ref 3.5–5.1)
Sodium: 142 mEq/L (ref 135–145)

## 2013-07-06 LAB — BLOOD GAS, ARTERIAL
Acid-Base Excess: 2.6 mmol/L — ABNORMAL HIGH (ref 0.0–2.0)
Bicarbonate: 27.2 mEq/L — ABNORMAL HIGH (ref 20.0–24.0)
FIO2: 0.4 %
MECHVT: 500 mL
O2 Saturation: 99.4 %
PEEP: 5 cmH2O
Patient temperature: 98.6
TCO2: 28.6 mmol/L (ref 0–100)
pH, Arterial: 7.385 (ref 7.350–7.450)
pO2, Arterial: 170 mmHg — ABNORMAL HIGH (ref 80.0–100.0)

## 2013-07-06 LAB — CBC
HCT: 29 % — ABNORMAL LOW (ref 39.0–52.0)
Hemoglobin: 8.9 g/dL — ABNORMAL LOW (ref 13.0–17.0)
MCHC: 30.7 g/dL (ref 30.0–36.0)
RBC: 3.29 MIL/uL — ABNORMAL LOW (ref 4.22–5.81)
RDW: 16.8 % — ABNORMAL HIGH (ref 11.5–15.5)
WBC: 11.9 10*3/uL — ABNORMAL HIGH (ref 4.0–10.5)

## 2013-07-06 LAB — PROTIME-INR: INR: 3.23 — ABNORMAL HIGH (ref 0.00–1.49)

## 2013-07-06 NOTE — Progress Notes (Signed)
PULMONARY  / CRITICAL CARE MEDICINE  Name: Tanner Wu MRN: [id number] DOB: 19-May-1921    ADMISSION DATE:  06/02/2013 CONSULTATION DATE:  9-30  REFERRING MD :  [facility] PRIMARY SERVICE: [facility]  CHIEF COMPLAINT:  VDRF  BRIEF SUMMARY:  79 yo with COPD s/p cervical spine fracture in August of 2014.  Course is complicated by acute respirator failure and difficulty to wean.    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 Nonunion fractures of the posterior arch of C1.  LINES / TUBES: OTT ??? >>> 10/1 Trach 10/1 >>>  SUBJECTIVE:  No overnight issues.  VITAL SIGNS: Vital signs reviewed. Abnormal values will appear under impression plan section.   PHYSICAL EXAMINATION: General:  Appears acutely ill, mechanically ventilated, synchronous on ps 12. 8 cpap with Vt 500. Neuro:  Encephalopathic, nonfocal, cough / gag diminished.  HEENT:  PERRL, OETT  Cardiovascular:  RRR, no m/r/g Lungs:  Bilateral diminished air entry, no w/r/r, decreased bs bases Abdomen:  Soft, nontender, bowel sounds diminished Musculoskeletal:  Moves all extremities, no edema Skin:  Intact  Recent Labs Lab 06/30/13 0540  07/02/13 0645 07/03/13 0530 07/04/13 0500 07/04/13 0600 07/04/13 1342 07/05/13 0500 07/06/13 0622 07/06/13 1305  HGB 9.8*  --  9.3*  --   --  9.6* 9.1*  --  8.9*  --   WBC 12.8*  --  10.7*  --   --  13.0* 11.7*  --  11.9*  --   PLT 221  --  223  --   --  218 230  --  203  --   NA 141  --  139 141  --  145 143  --  142  --   K 4.4  --  5.0  4.4  --  4.6 5.0  --  4.2  --   CL 101  --  102 103  --  106 105  --  107  --   CO2 31  --  29 30  --  30 30  --  29  --   GLUCOSE 94  --  87 110*  --  106* 128*  --  106*  --   BUN 49*  --  48* 46*  --  48* 46*  --  36*  --   CREATININE 0.88  --  0.83 0.81  --  0.75 0.74  --  0.80  --   CALCIUM 9.2  --  9.3 9.2  --  9.2 9.0  --  8.8  --   INR 3.51*  < > 2.81* 2.66*  --  2.67*  --  2.71* 3.23*  --   PHART  --   --   --   --  7.412  --   --   --   --  7.385  PCO2ART  --   --   --   --  48.1*  --   --   --   --  46.5*  PO2ART  --   --   --   --  135.0*  --   --   --   --  170.0*  HCO3  --   --   --   --  30.0*  --   --   --   --  27.2*  O2SAT  --   --   --   --  99.2  --   --   --   --  99.4  < > = values in this interval not displayed.  No results found for this basename: GLUCAP,  in the last 168 hours  CXR:  ASSESSMENT / PLAN:  Acute on chronic respiratory failure COPD Chronic aspiration S/p tracheostomy C-spine fracture  -->  Abandon weaning trials -->  Awaiting home vent set up -->  CXR PRN -->  Continue budesonide, atrovent, xopenex -->  C-collar -->  DNR  Brett Canales Minor ACNP [facility] PCCM Pager [phone] till 3 pm If no answer page [phone] 07/07/2013, 11:06 AM  I have personally obtained history, examined patient, evaluated and interpreted laboratory and imaging results, reviewed medical records, formulated assessment / plan and placed orders.  Lonia Farber, MD Pulmonary and Critical Care Medicine [facility] Pager: [phone]  07/06/2013, 9:03 PM

## 2013-07-06 NOTE — Progress Notes (Signed)
*  PRELIMINARY RESULTS* Vascular Ultrasound Left lower extremity venous duplex has been completed.  Preliminary findings: No evidence of DVT. Superficial thrombosis noted in left cephalic vein at the antecubital fossa area.    Farrel Demark, RDMS, RVT  07/06/2013, 3:01 PM

## 2013-07-07 ENCOUNTER — Other Ambulatory Visit (HOSPITAL_COMMUNITY): Payer: Self-pay

## 2013-07-07 DIAGNOSIS — J4489 Other specified chronic obstructive pulmonary disease: Secondary | ICD-10-CM

## 2013-07-07 DIAGNOSIS — S129XXA Fracture of neck, unspecified, initial encounter: Secondary | ICD-10-CM

## 2013-07-07 DIAGNOSIS — J449 Chronic obstructive pulmonary disease, unspecified: Secondary | ICD-10-CM

## 2013-07-07 LAB — BASIC METABOLIC PANEL
BUN: 30 mg/dL — ABNORMAL HIGH (ref 6–23)
CO2: 29 mEq/L (ref 19–32)
Calcium: 8.5 mg/dL (ref 8.4–10.5)
Creatinine, Ser: 0.85 mg/dL (ref 0.50–1.35)
GFR calc non Af Amer: 74 mL/min — ABNORMAL LOW (ref >90)
Potassium: 4 mEq/L (ref 3.5–5.1)

## 2013-07-07 LAB — CBC
HCT: 28.7 % — ABNORMAL LOW (ref 39.0–52.0)
MCH: 26.7 pg (ref 26.0–34.0)
MCV: 88 fL (ref 78.0–100.0)
Platelets: 207 10*3/uL (ref 150–400)
RBC: 3.26 MIL/uL — ABNORMAL LOW (ref 4.22–5.81)

## 2013-07-07 LAB — VANCOMYCIN, TROUGH: Vancomycin Tr: 17.1 ug/mL (ref 10.0–20.0)

## 2013-07-08 ENCOUNTER — Other Ambulatory Visit (HOSPITAL_COMMUNITY): Payer: Self-pay

## 2013-07-09 LAB — PROTIME-INR
INR: 1.76 — ABNORMAL HIGH (ref 0.00–1.49)
Prothrombin Time: 20 seconds — ABNORMAL HIGH (ref 11.6–15.2)

## 2013-07-09 LAB — BASIC METABOLIC PANEL
BUN: 31 mg/dL — ABNORMAL HIGH (ref 6–23)
CO2: 26 mEq/L (ref 19–32)
Chloride: 106 mEq/L (ref 96–112)
Glucose, Bld: 105 mg/dL — ABNORMAL HIGH (ref 70–99)
Potassium: 3.7 mEq/L (ref 3.5–5.1)
Sodium: 141 mEq/L (ref 135–145)

## 2013-07-09 LAB — CBC
HCT: 27.5 % — ABNORMAL LOW (ref 39.0–52.0)
Hemoglobin: 8.5 g/dL — ABNORMAL LOW (ref 13.0–17.0)
MCHC: 30.9 g/dL (ref 30.0–36.0)
MCV: 87.9 fL (ref 78.0–100.0)
RBC: 3.13 MIL/uL — ABNORMAL LOW (ref 4.22–5.81)
WBC: 10.2 10*3/uL (ref 4.0–10.5)

## 2013-07-10 LAB — CULTURE, BLOOD (ROUTINE X 2): Culture: NO GROWTH

## 2013-07-10 LAB — PROTIME-INR: INR: 1.67 — ABNORMAL HIGH (ref 0.00–1.49)

## 2013-07-11 LAB — PROTIME-INR
INR: 1.77 — ABNORMAL HIGH (ref 0.00–1.49)
Prothrombin Time: 20.1 seconds — ABNORMAL HIGH (ref 11.6–15.2)

## 2014-02-27 IMAGING — CR DG CHEST 1V PORT
1 series · 1 of 1 positions shown · non-contrast
Comparison: 06/20/2013

CLINICAL DATA: Respiratory failure.

EXAM:
PORTABLE CHEST - 1 VIEW

[AP]
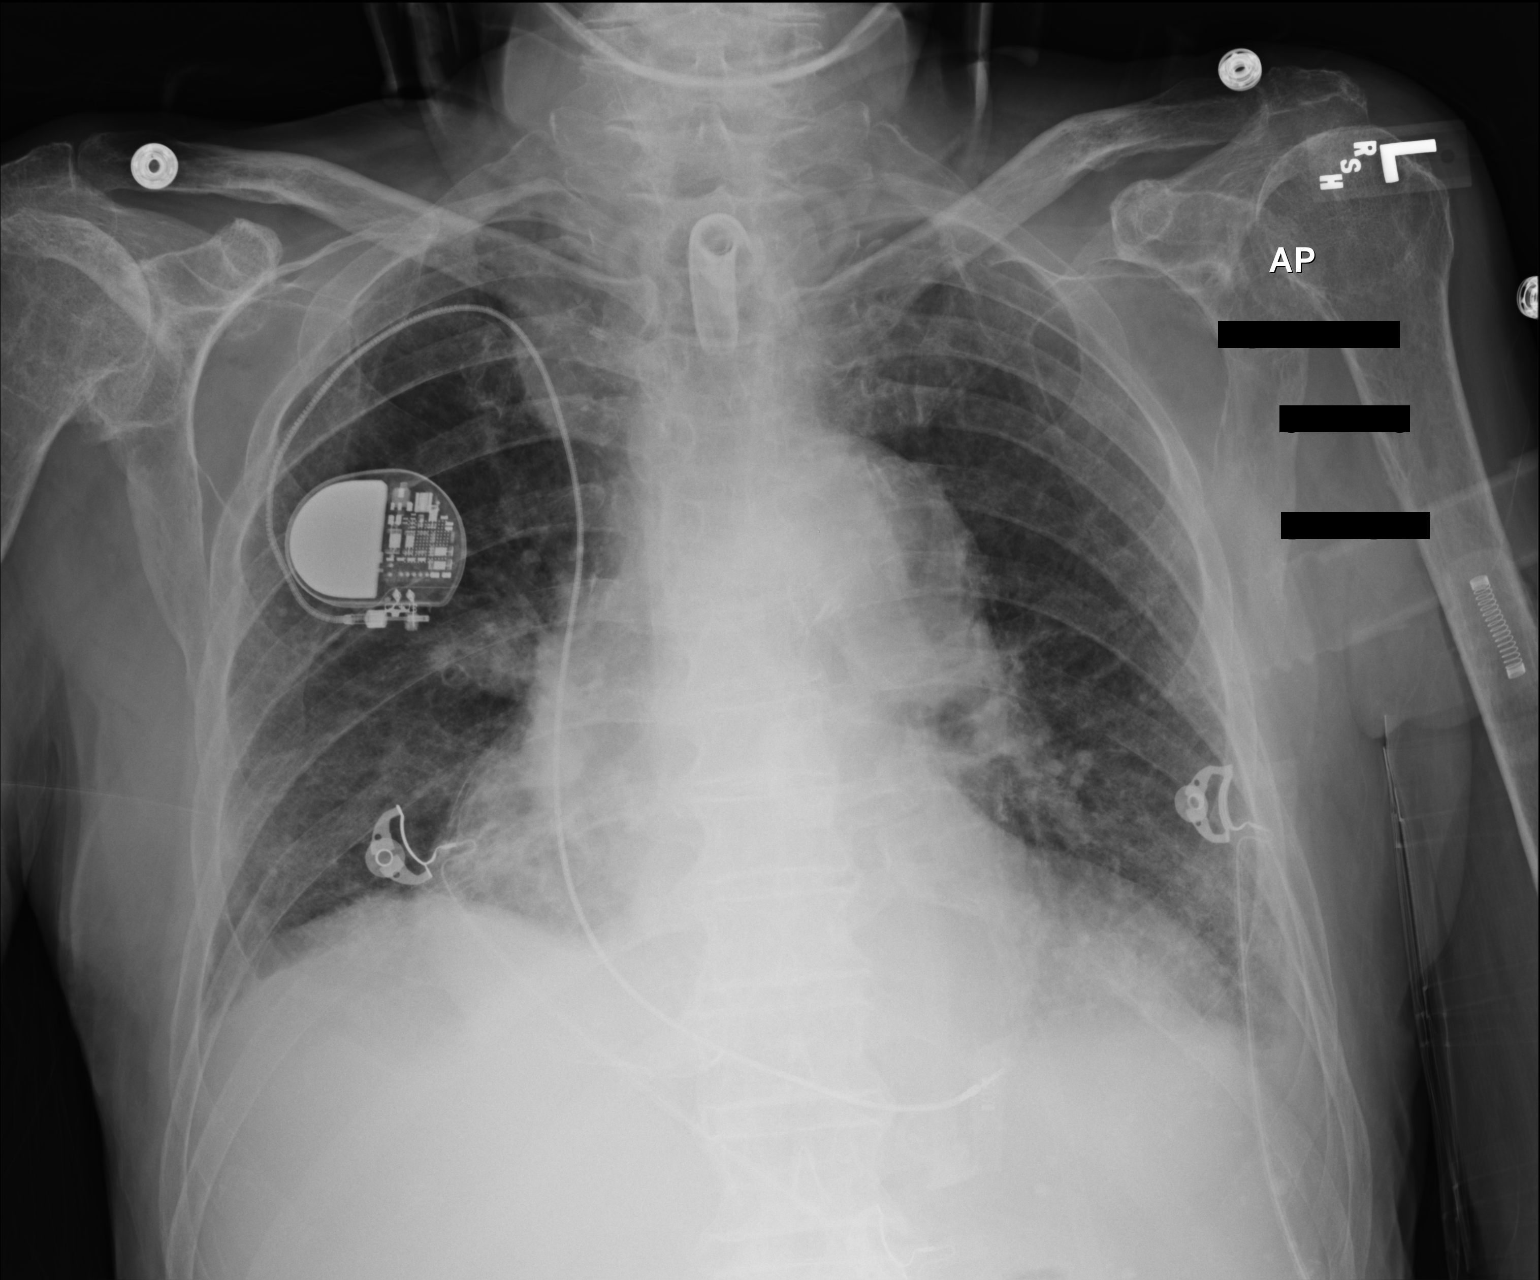

[1 of 1 positions shown; findings below may reference images not displayed]

FINDINGS: Stable enlargement of the cardiac silhouette. Stable position of the
single lead cardiac pacemaker. Tracheostomy tube is present. Again
noted is a prominent right paratracheal density which may be
vascular but indeterminate. Slightly increased densities at the left
lung base that are most compatible with atelectasis. Difficult to
exclude small effusions.
IMPRESSION: Slightly increased densities at the left lung base and difficult to
exclude small pleural effusions.

## 2014-03-06 IMAGING — CT CT HEAD W/O CM
2 series · 16 of 30 positions shown, 20 images · non-contrast
Comparison: None.

CLINICAL DATA: Increased lethargy. Evaluate for bleed.

EXAM:
CT HEAD WITHOUT CONTRAST
TECHNIQUE: Contiguous axial images were obtained from the base of the skull
through the vertex without intravenous contrast.

[Series 2: head w/o · axial · non-contrast · 0.49mm/px · z∈[+118,+273]mm · 13 of 37 slices shown, 17 images]
[im 3/37  brain]
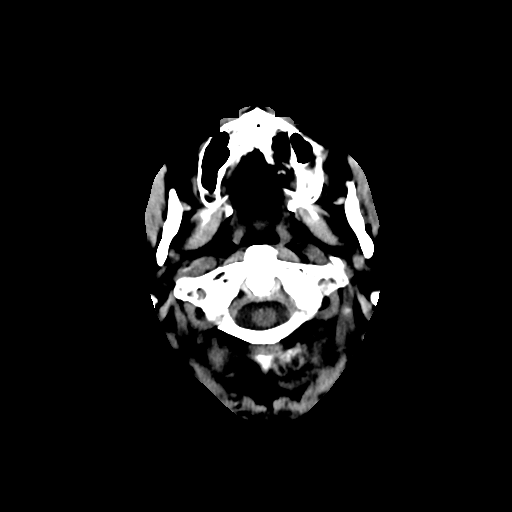
[im 3/37  bone]
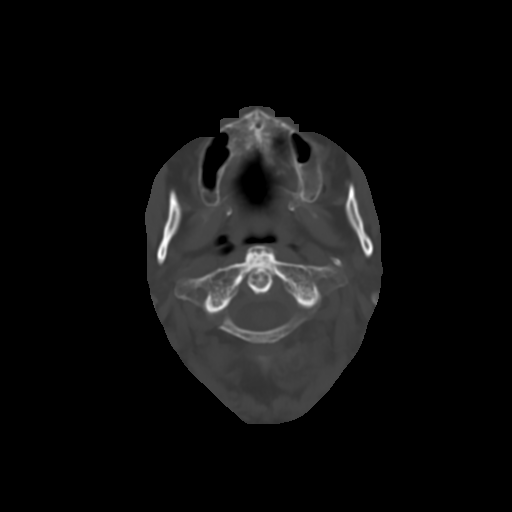
[im 6/37  brain]
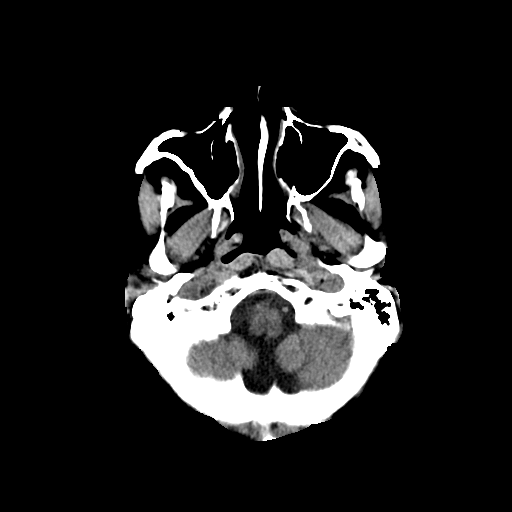
[im 8/37  brain]
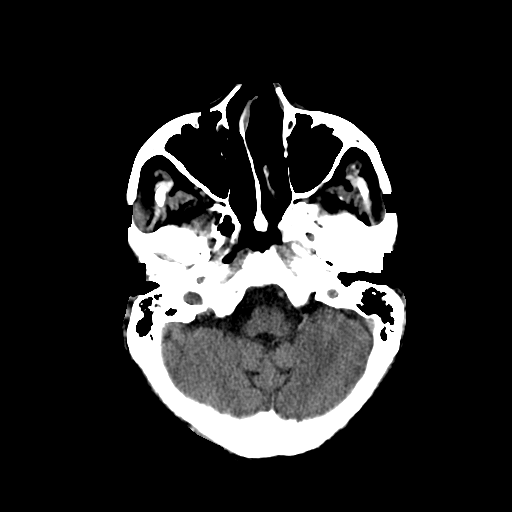
[im 11/37  brain]
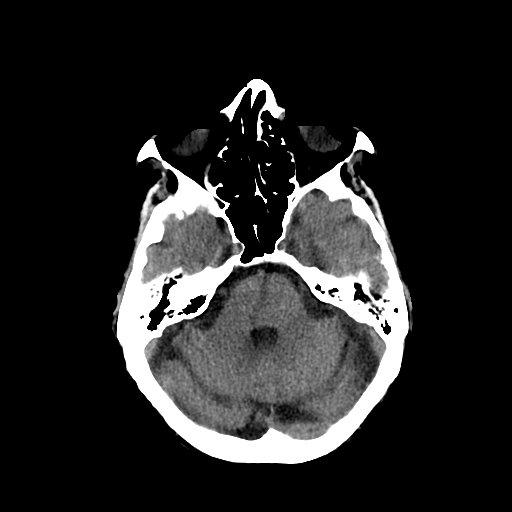
[im 13/37  brain]
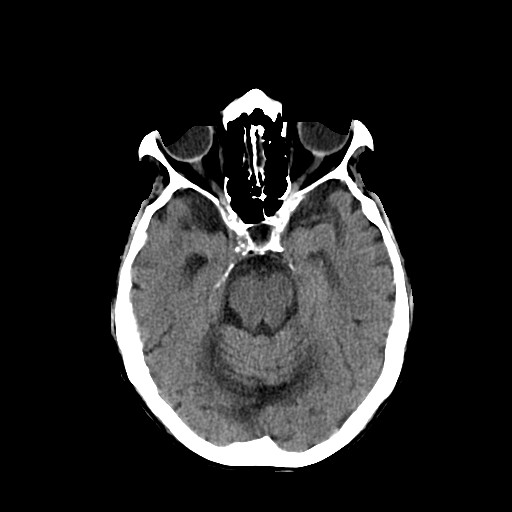
[im 13/37  bone]
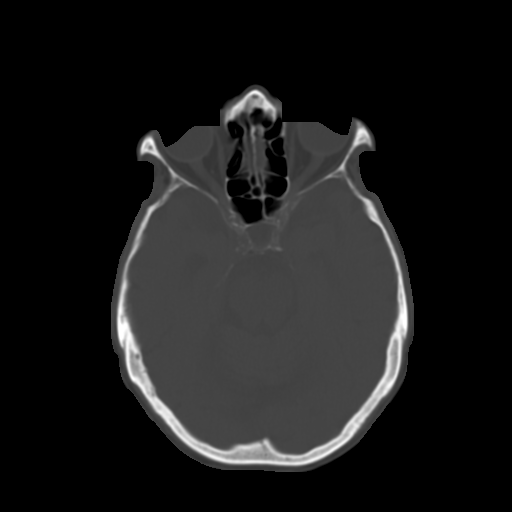
[im 16/37  brain]
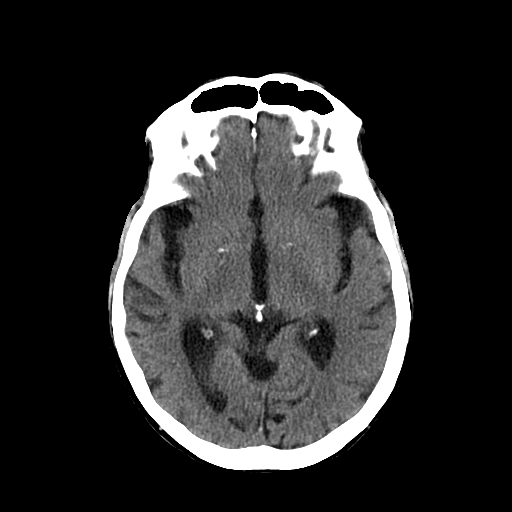
[im 19/37  brain]
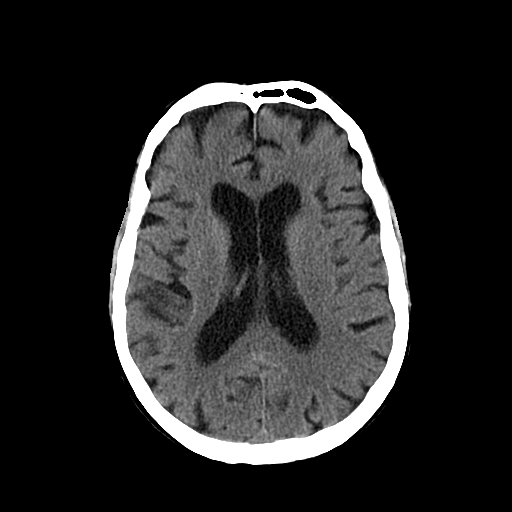
[im 21/37  brain]
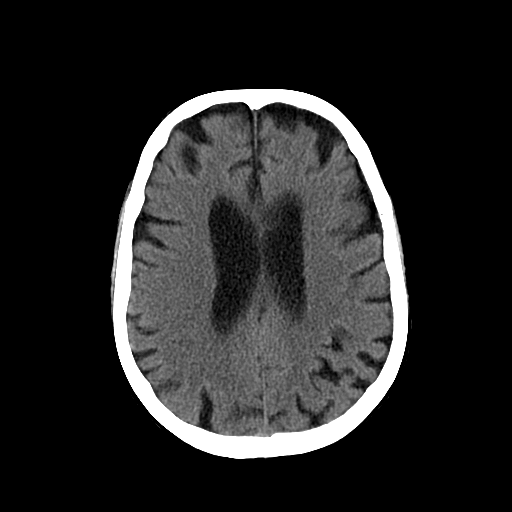
[im 24/37  brain]
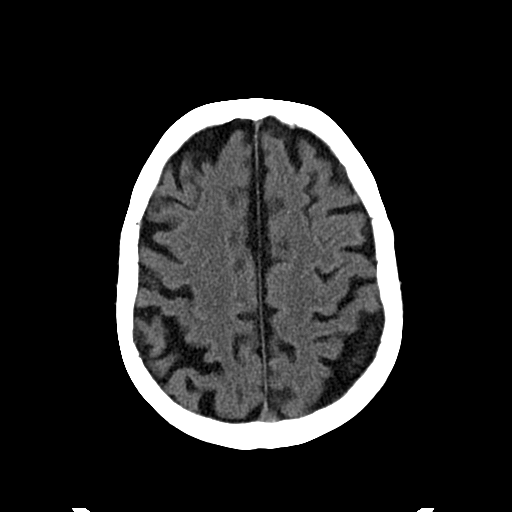
[im 24/37  bone]
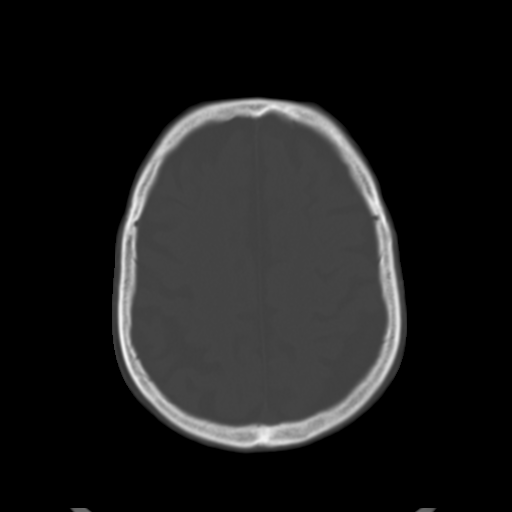
[im 26/37  brain]
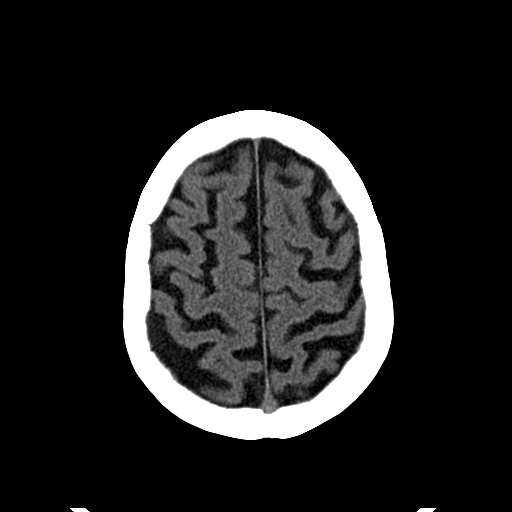
[im 29/37  brain]
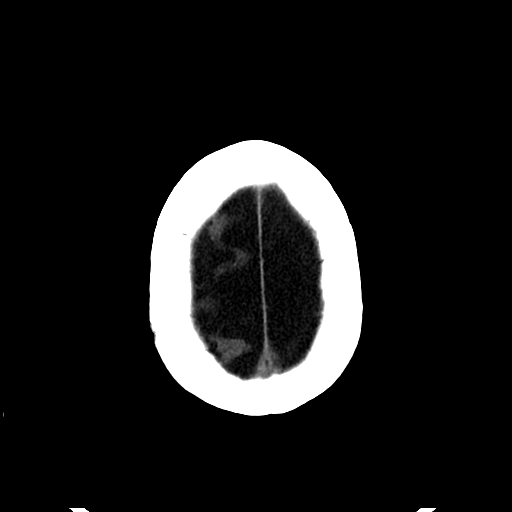
[im 31/37  brain]
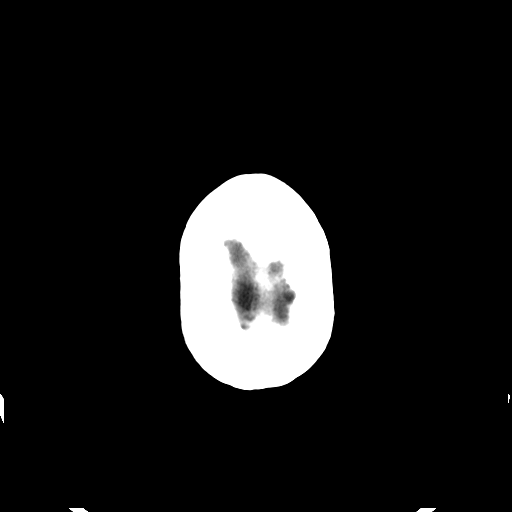
[im 34/37  brain]
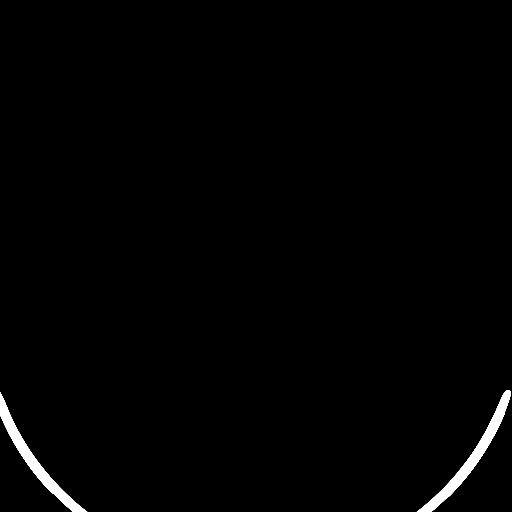
[im 34/37  bone]
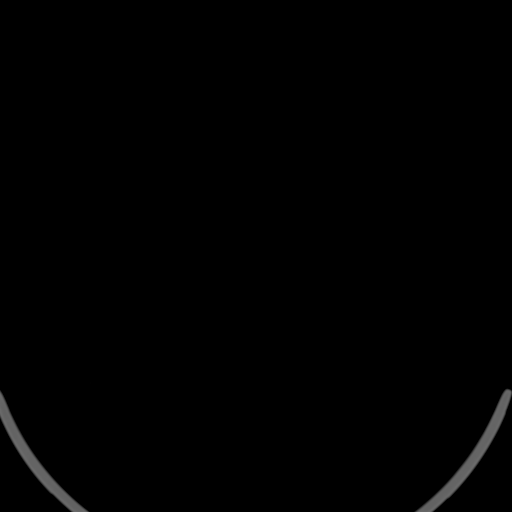

[Series 3: head w/o bone · axial · non-contrast · 0.49mm/px · z∈[+118,+168]mm · 3 of 37 slices shown]
[im 3/37  bone]
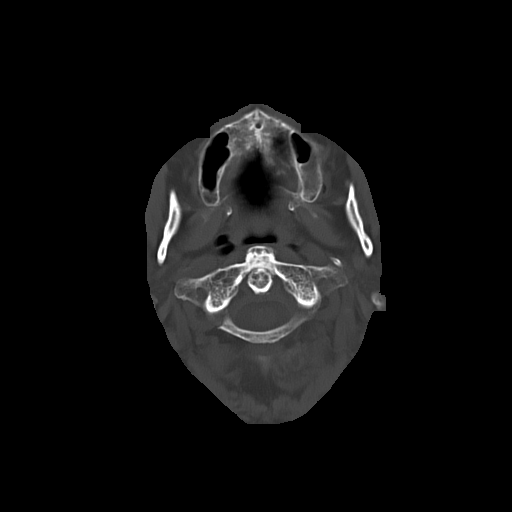
[im 8/37  bone]
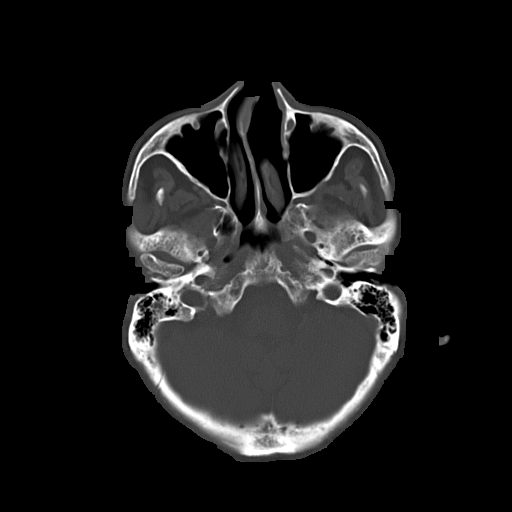
[im 13/37  bone]
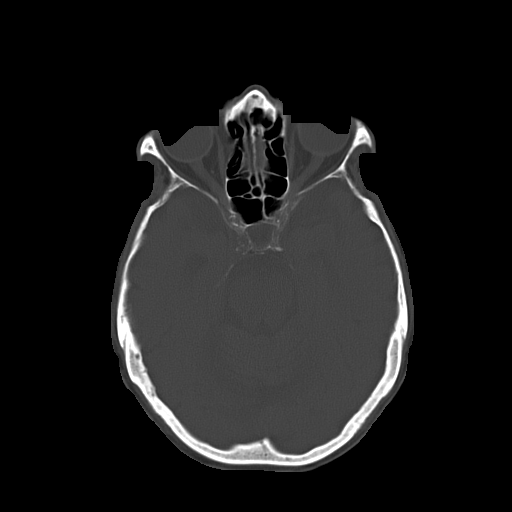

[16 of 30 positions shown; findings below may reference images not displayed]

FINDINGS: No intracranial hemorrhage.

Small vessel disease type changes without CT evidence of large acute
infarct.

No intracranial mass lesion noted on this unenhanced exam.

Global atrophy. Ventricular prominence felt to be related to atrophy
rather than hydrocephalus.

Intracranial vascular calcifications.

Degenerative changes C1-2 articulation.

Minimal partial opacification right mastoid air cells.
IMPRESSION: No intracranial hemorrhage or CT evidence of large acute infarct.

Atrophy.

## 2014-03-06 IMAGING — CR DG CHEST 1V PORT
1 series · 1 of 1 positions shown · non-contrast
Comparison: 07/02/2013

CLINICAL DATA: Ventilator dependent respiratory failure.

EXAM:
PORTABLE CHEST - 1 VIEW

[AP]
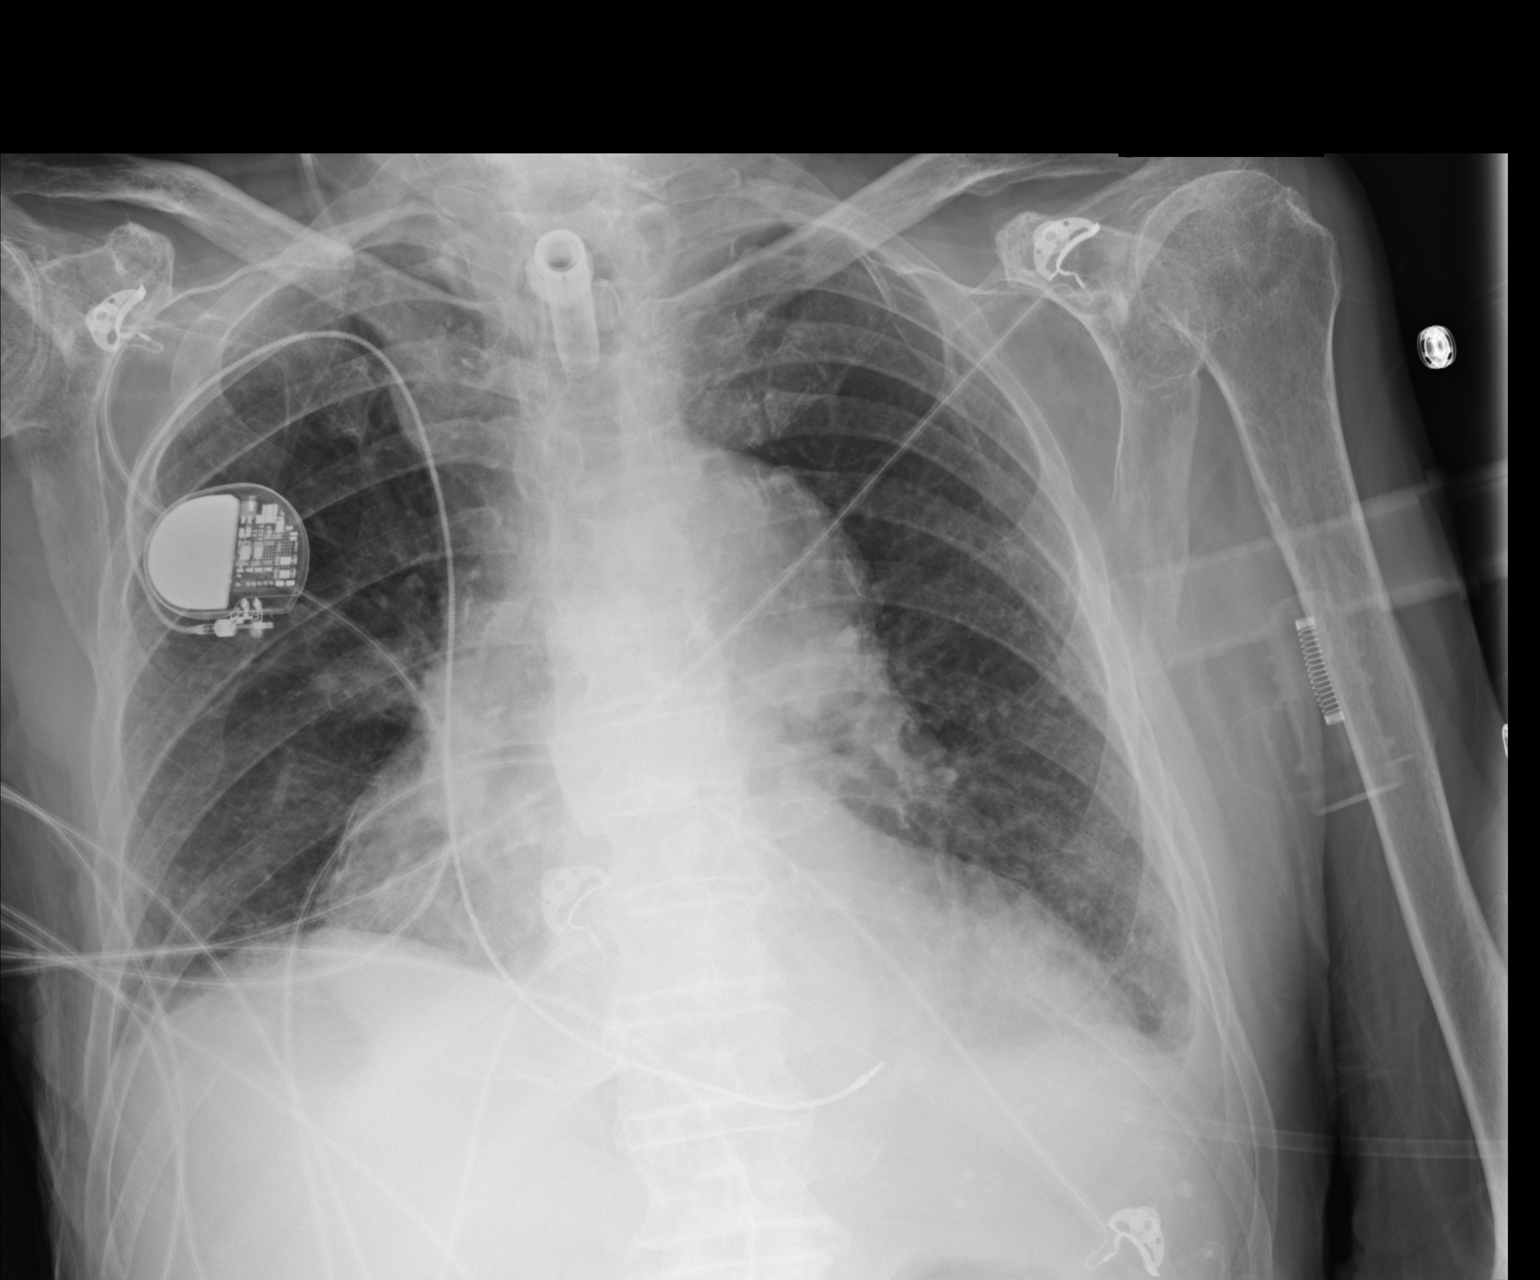

[1 of 1 positions shown; findings below may reference images not displayed]

FINDINGS: Single lead pacer is unchanged. Tracheostomy is appropriately
positioned.

Cardiomegaly with a tortuous thoracic aorta. Mild left costophrenic
angle blunting is chronic and favored to represent pleural fluid. No
pneumothorax. Similar left greater than right bibasilar opacities.
IMPRESSION: No significant change since the prior exam.

Cardiomegaly with trace left-sided pleural thickening versus less
likely fluid.

Similar bibasilar opacities, favored to represent areas of scarring
and atelectasis.

## 2014-07-04 ENCOUNTER — Other Ambulatory Visit (HOSPITAL_COMMUNITY): Payer: MEDICARE

## 2014-07-04 ENCOUNTER — Inpatient Hospital Stay
Admission: EM | Admit: 2014-07-04 | Discharge: 2014-08-04 | Disposition: A | Discharge status: Home Health | Payer: MEDICARE | Source: Intra-hospital | Attending: Internal Medicine | Admitting: Internal Medicine

## 2014-07-04 DIAGNOSIS — Z4659 Encounter for fitting and adjustment of other gastrointestinal appliance and device: Secondary | ICD-10-CM

## 2014-07-04 DIAGNOSIS — Z930 Tracheostomy status: Secondary | ICD-10-CM

## 2014-07-04 DIAGNOSIS — J449 Chronic obstructive pulmonary disease, unspecified: Secondary | ICD-10-CM | POA: Diagnosis present

## 2014-07-04 DIAGNOSIS — J9690 Respiratory failure, unspecified, unspecified whether with hypoxia or hypercapnia: Secondary | ICD-10-CM

## 2014-07-04 DIAGNOSIS — I82C19 Acute embolism and thrombosis of unspecified internal jugular vein: Secondary | ICD-10-CM

## 2014-07-04 DIAGNOSIS — Z9911 Dependence on respirator [ventilator] status: Secondary | ICD-10-CM

## 2014-07-04 DIAGNOSIS — R140 Abdominal distension (gaseous): Secondary | ICD-10-CM | POA: Insufficient documentation

## 2014-07-04 DIAGNOSIS — J189 Pneumonia, unspecified organism: Secondary | ICD-10-CM | POA: Insufficient documentation

## 2014-07-04 DIAGNOSIS — R509 Fever, unspecified: Secondary | ICD-10-CM

## 2014-07-04 DIAGNOSIS — J9620 Acute and chronic respiratory failure, unspecified whether with hypoxia or hypercapnia: Secondary | ICD-10-CM

## 2014-07-04 HISTORY — DX: Paroxysmal atrial fibrillation: I48.0

## 2014-07-04 HISTORY — DX: Chronic obstructive pulmonary disease, unspecified: J44.9

## 2014-07-04 HISTORY — DX: Quadriplegia, unspecified: G82.50

## 2014-07-04 HISTORY — DX: Dependence on respirator (ventilator) status: Z99.11

## 2014-07-04 HISTORY — DX: Urinary tract infection, site not specified: N39.0

## 2014-07-04 HISTORY — DX: Unspecified severe protein-calorie malnutrition: E43

## 2014-07-04 LAB — BLOOD GAS, ARTERIAL
Acid-Base Excess: 4.3 mmol/L — ABNORMAL HIGH (ref 0.0–2.0)
Bicarbonate: 30.3 mEq/L — ABNORMAL HIGH (ref 20.0–24.0)
FIO2: 0.28 %
O2 Saturation: 98.8 %
PCO2 ART: 63.1 mmHg — AB (ref 35.0–45.0)
PH ART: 7.302 — AB (ref 7.350–7.450)
PO2 ART: 167 mmHg — AB (ref 80.0–100.0)
Patient temperature: 98.6
TCO2: 32.2 mmol/L (ref 0–100)

## 2014-07-04 LAB — VANCOMYCIN, TROUGH: VANCOMYCIN TR: 24.5 ug/mL — AB (ref 10.0–20.0)

## 2014-07-05 ENCOUNTER — Other Ambulatory Visit (HOSPITAL_COMMUNITY): Payer: MEDICARE

## 2014-07-05 LAB — BLOOD GAS, ARTERIAL
ACID-BASE EXCESS: 5.8 mmol/L — AB (ref 0.0–2.0)
BICARBONATE: 30.5 meq/L — AB (ref 20.0–24.0)
FIO2: 0.5 %
MECHVT: 450 mL
O2 Saturation: 94.1 %
PATIENT TEMPERATURE: 98.6
PEEP: 5 cmH2O
RATE: 12 resp/min
TCO2: 32.1 mmol/L (ref 0–100)
pCO2 arterial: 51.2 mmHg — ABNORMAL HIGH (ref 35.0–45.0)
pH, Arterial: 7.393 (ref 7.350–7.450)
pO2, Arterial: 69.9 mmHg — ABNORMAL LOW (ref 80.0–100.0)

## 2014-07-05 LAB — COMPREHENSIVE METABOLIC PANEL
ALBUMIN: 1.5 g/dL — AB (ref 3.5–5.2)
ALT: 26 U/L (ref 0–53)
AST: 38 U/L — AB (ref 0–37)
Alkaline Phosphatase: 269 U/L — ABNORMAL HIGH (ref 39–117)
Anion gap: 8 (ref 5–15)
BUN: 35 mg/dL — ABNORMAL HIGH (ref 6–23)
CALCIUM: 8.9 mg/dL (ref 8.4–10.5)
CO2: 32 mEq/L (ref 19–32)
Chloride: 103 mEq/L (ref 96–112)
Creatinine, Ser: 0.84 mg/dL (ref 0.50–1.35)
GFR calc non Af Amer: 74 mL/min — ABNORMAL LOW (ref >90)
GFR, EST AFRICAN AMERICAN: 86 mL/min — AB (ref >90)
Glucose, Bld: 100 mg/dL — ABNORMAL HIGH (ref 70–99)
POTASSIUM: 4.1 meq/L (ref 3.7–5.3)
SODIUM: 143 meq/L (ref 137–147)
TOTAL PROTEIN: 5.9 g/dL — AB (ref 6.0–8.3)
Total Bilirubin: 0.4 mg/dL (ref 0.3–1.2)

## 2014-07-05 LAB — TSH: TSH: 2.3 u[IU]/mL (ref 0.350–4.500)

## 2014-07-05 LAB — CBC
HCT: 28.6 % — ABNORMAL LOW (ref 39.0–52.0)
Hemoglobin: 8.8 g/dL — ABNORMAL LOW (ref 13.0–17.0)
MCH: 27.1 pg (ref 26.0–34.0)
MCHC: 30.8 g/dL (ref 30.0–36.0)
MCV: 88 fL (ref 78.0–100.0)
PLATELETS: 261 10*3/uL (ref 150–400)
RBC: 3.25 MIL/uL — ABNORMAL LOW (ref 4.22–5.81)
RDW: 19.1 % — ABNORMAL HIGH (ref 11.5–15.5)
WBC: 16.1 10*3/uL — ABNORMAL HIGH (ref 4.0–10.5)

## 2014-07-05 LAB — GENTAMICIN LEVEL, TROUGH: GENTAMICIN TR: 3.1 ug/mL — AB (ref 0.5–2.0)

## 2014-07-06 ENCOUNTER — Encounter: Payer: Self-pay | Admitting: Adult Health

## 2014-07-06 DIAGNOSIS — Z9911 Dependence on respirator [ventilator] status: Secondary | ICD-10-CM

## 2014-07-06 DIAGNOSIS — J9611 Chronic respiratory failure with hypoxia: Secondary | ICD-10-CM

## 2014-07-06 DIAGNOSIS — J411 Mucopurulent chronic bronchitis: Secondary | ICD-10-CM

## 2014-07-06 DIAGNOSIS — J189 Pneumonia, unspecified organism: Secondary | ICD-10-CM

## 2014-07-06 LAB — GENTAMICIN LEVEL, RANDOM: GENTAMICIN RM: 1.4 ug/mL

## 2014-07-06 LAB — VANCOMYCIN, RANDOM: VANCOMYCIN RM: 17.3 ug/mL

## 2014-07-06 NOTE — Consult Note (Signed)
Name: Tanner SpottedHugh Seabolt MRN: [id number] DOB: 08-04-1921    ADMISSION DATE:  07/04/2014 CONSULTATION DATE:  11/2  REFERRING MD :  Hijazi  CHIEF COMPLAINT:  Vent mgmt   BRIEF PATIENT DESCRIPTION: 80 yo male with chronic resp failure on home vent r/t C2-C3 quadriplegia, COPD, recurrent UTI initially admitted to outside hospital 10/29 with HCAP and acute on chronic mixed CHF.  He was treated with diuresis, abx and tx to [facility] 10/31.  PCCM consulted to assist with vent management.   SIGNIFICANT EVENTS    STUDIES:     HISTORY OF PRESENT ILLNESS: 80 yo male with chronic resp failure on home vent r/t C2-C3 quadriplegia, COPD, recurrent UTI initially admitted to outside hospital 10/29 with HCAP and acute on chronic mixed CHF.  He was treated with diuresis, abx and tx to [facility] 10/31.  PCCM consulted to assist with vent management.    PAST MEDICAL HISTORY :   has a past medical history of COPD (chronic obstructive pulmonary disease); Ventilator dependence; Recurrent urinary tract infection; Quadriplegia; PAF (paroxysmal atrial fibrillation); and Protein-calorie malnutrition, severe.  has no past surgical history on file. Prior to Admission medications   Not on File   Not on File  FAMILY HISTORY:  family history is not on file. SOCIAL HISTORY:    REVIEW OF SYSTEMS:   Unable.    SUBJECTIVE:   VITAL SIGNS:   Temp 98.4 HR 76 BP 97/55 RR 21 O2 100% on vent  Vent: ACVC Rate 20 TVol 450, flow rate 5480mL/min, PEEP 5, FiO2 40%   PHYSICAL EXAMINATION: General:  Somnolent on vent Neuro:  Somnolent but arouses to touch, follows comands HEENT:  Trach size #8 Cardiovascular:  Irreg irreg Lungs:  Rhonchi R base Abdomen:  BS+, PEG Musculoskeletal:  Minimal bulk, spastic LUE Skin:  Thin, multiple bruises throughout   Recent Labs Lab 07/05/14 0848  NA 143  K 4.1  CL 103  CO2 32  BUN 35*  CREATININE 0.84  GLUCOSE 100*    Recent Labs Lab 07/05/14 0848  HGB 8.8*  HCT  28.6*  WBC 16.1*  PLT 261   Dg Chest Port 1 View  07/05/2014   CLINICAL DATA:  Acute respiratory failure.  Tracheostomy.  EXAM: PORTABLE CHEST - 1 VIEW  COMPARISON:  07/04/2014  FINDINGS: Tracheostomy tube and single lead pacemaker remain in appropriate position. Moderate cardiomegaly is stable. Mixed interstitial and airspace disease in the lower lung zones shows no significant change, consistent with pulmonary edema. Small bilateral pleural effusions are also stable.  IMPRESSION: No significant change compared with prior exam.   Electronically Signed   By: Myles RosenthalJohn  Stahl M.D.   On: 07/05/2014 11:05   Dg Chest Port 1 View  07/04/2014   CLINICAL DATA:  Respiratory failure.  EXAM: PORTABLE CHEST - 1 VIEW  COMPARISON:  07/08/2013.  FINDINGS: Tracheostomy tube noted in good anatomic position. Cardiac pacer with tip in right ventricle. Cardiomegaly diffuse from interstitial prominence and bilateral small pleural effusions. These findings are consistent with congestive heart failure. No pneumothorax.  IMPRESSION: Congestive heart failure with pulmonary interstitial edema and bilateral pleural effusions.   Electronically Signed   By: Maisie Fushomas  Register   On: 07/04/2014 16:35   Dg Abd Portable 1v  07/05/2014   CLINICAL DATA:  Nasogastric tube placement  EXAM: PORTABLE ABDOMEN - 1 VIEW  COMPARISON:  CT abdomen and pelvis June 22, 2013  FINDINGS: There is no demonstrable nasogastric tube. There is a gastrostomy catheter in the  left upper quadrant expected location of the stomach. Bowel gas pattern is unremarkable. No obstruction or free air is seen on this supine examination. There are multiple calcified splenic granulomas. There is a right pleural effusion. There is patchy infiltrate in the lung bases.  IMPRESSION: Bowel gas pattern unremarkable. Gastrostomy catheter in left upper quadrant. No nasogastric tube appreciable.   Electronically Signed   By: Bretta BangWilliam  Woodruff M.D.   On: 07/05/2014 13:49    ASSESSMENT  / PLAN:  Acute on chronic respiratory failure  Chronic vent dependent  HCAP - stenotrophomonas, yeast per OSH  Acute on chronic mixed CHF Hx PAF  Plan -  Cont vent support  Ok for weaning trials as tol but no plan to liberate from vent completely  Family would like to be able to have him do ATC a few hours at a time which is reasonable to attempt here Intermittent f/u CXR PRN BD  Keep dry as BO, renal function tolerate  PT/OT  HR control  Great HCAP as your are doing  Dirk DressKaty Whiteheart, NP 07/06/2014  12:04 PM Pager: [phone] or [phone]) [phone]  Attending:  I have seen and examined the patient with nurse practitioner/resident and agree with the note above.   He is arousable and comfortable on the vent, some rhonchi RLL.  Agree with treatment as you are doing.  Would be nice to try to get him to ATC just a few hours at a time (or even a few minutes) so family can move him around house easier.  Will attempt ATC weaning here.  Continue HCAP.  Heber CarolinaBrent McQuaid, MD [place] [facility] Pager: [phone] Cell: [phone] If no response, call [phone]

## 2014-07-07 LAB — BASIC METABOLIC PANEL
Anion gap: 10 (ref 5–15)
BUN: 43 mg/dL — ABNORMAL HIGH (ref 6–23)
CO2: 32 mEq/L (ref 19–32)
Calcium: 8.5 mg/dL (ref 8.4–10.5)
Chloride: 101 mEq/L (ref 96–112)
Creatinine, Ser: 0.89 mg/dL (ref 0.50–1.35)
GFR calc Af Amer: 84 mL/min — ABNORMAL LOW (ref >90)
GFR calc non Af Amer: 73 mL/min — ABNORMAL LOW (ref >90)
GLUCOSE: 81 mg/dL (ref 70–99)
POTASSIUM: 4.3 meq/L (ref 3.7–5.3)
Sodium: 143 mEq/L (ref 137–147)

## 2014-07-07 LAB — CBC
HEMATOCRIT: 29.5 % — AB (ref 39.0–52.0)
HEMOGLOBIN: 8.9 g/dL — AB (ref 13.0–17.0)
MCH: 26.1 pg (ref 26.0–34.0)
MCHC: 30.2 g/dL (ref 30.0–36.0)
MCV: 86.5 fL (ref 78.0–100.0)
Platelets: 279 10*3/uL (ref 150–400)
RBC: 3.41 MIL/uL — ABNORMAL LOW (ref 4.22–5.81)
RDW: 19 % — ABNORMAL HIGH (ref 11.5–15.5)
WBC: 18 10*3/uL — ABNORMAL HIGH (ref 4.0–10.5)

## 2014-07-08 LAB — VANCOMYCIN, RANDOM: Vancomycin Rm: 10.3 ug/mL

## 2014-07-08 LAB — GENTAMICIN LEVEL, RANDOM: Gentamicin Rm: 1 ug/mL

## 2014-07-09 ENCOUNTER — Other Ambulatory Visit (HOSPITAL_COMMUNITY): Payer: MEDICARE

## 2014-07-09 DIAGNOSIS — J9622 Acute and chronic respiratory failure with hypercapnia: Secondary | ICD-10-CM

## 2014-07-09 DIAGNOSIS — I482 Chronic atrial fibrillation: Secondary | ICD-10-CM

## 2014-07-09 LAB — BASIC METABOLIC PANEL
Anion gap: 12 (ref 5–15)
BUN: 50 mg/dL — ABNORMAL HIGH (ref 6–23)
CO2: 32 mEq/L (ref 19–32)
Calcium: 8.7 mg/dL (ref 8.4–10.5)
Chloride: 97 mEq/L (ref 96–112)
Creatinine, Ser: 0.91 mg/dL (ref 0.50–1.35)
GFR calc Af Amer: 83 mL/min — ABNORMAL LOW (ref >90)
GFR, EST NON AFRICAN AMERICAN: 72 mL/min — AB (ref >90)
Glucose, Bld: 92 mg/dL (ref 70–99)
Potassium: 4.9 mEq/L (ref 3.7–5.3)
Sodium: 141 mEq/L (ref 137–147)

## 2014-07-09 LAB — CBC WITH DIFFERENTIAL/PLATELET
Basophils Absolute: 0.1 10*3/uL (ref 0.0–0.1)
Basophils Relative: 0 % (ref 0–1)
EOS ABS: 0.5 10*3/uL (ref 0.0–0.7)
EOS PCT: 2 % (ref 0–5)
HCT: 29.4 % — ABNORMAL LOW (ref 39.0–52.0)
Hemoglobin: 9 g/dL — ABNORMAL LOW (ref 13.0–17.0)
LYMPHS ABS: 1.9 10*3/uL (ref 0.7–4.0)
Lymphocytes Relative: 9 % — ABNORMAL LOW (ref 12–46)
MCH: 26.5 pg (ref 26.0–34.0)
MCHC: 30.6 g/dL (ref 30.0–36.0)
MCV: 86.5 fL (ref 78.0–100.0)
Monocytes Absolute: 1.2 10*3/uL — ABNORMAL HIGH (ref 0.1–1.0)
Monocytes Relative: 5 % (ref 3–12)
Neutro Abs: 18.2 10*3/uL — ABNORMAL HIGH (ref 1.7–7.7)
Neutrophils Relative %: 84 % — ABNORMAL HIGH (ref 43–77)
Platelets: 262 10*3/uL (ref 150–400)
RBC: 3.4 MIL/uL — AB (ref 4.22–5.81)
RDW: 18.9 % — ABNORMAL HIGH (ref 11.5–15.5)
WBC: 21.8 10*3/uL — ABNORMAL HIGH (ref 4.0–10.5)

## 2014-07-09 LAB — SEDIMENTATION RATE: Sed Rate: 43 mm/hr — ABNORMAL HIGH (ref 0–16)

## 2014-07-09 NOTE — Progress Notes (Signed)
   Name: Tanner Wu MRN: [id number] DOB: Aug 13, 1921    ADMISSION DATE:  07/04/2014 CONSULTATION DATE:  11/2  REFERRING MD :  Hijazi  CHIEF COMPLAINT:  Vent mgmt   BRIEF PATIENT DESCRIPTION: 80 yo male with chronic resp failure on home vent r/t C2-C3 quadriplegia, COPD, recurrent UTI initially admitted to outside hospital 10/29 with HCAP and acute on chronic mixed CHF.  He was treated with diuresis, abx and tx to [facility] 10/31.  PCCM consulted to assist with vent management.   SIGNIFICANT EVENTS    STUDIES:    SUBJECTIVE: Fever this week, failed ATC attempts  VITAL SIGNS:   Temp 99 HR 82 BP 102/47 RR 21 O2 99% on vent  Vent: ACVC Rate 20 TVol 450, flow rate 2180mL/min, PEEP 5, FiO2 40%   PHYSICAL EXAMINATION: General:  No acute distress on vent Neuro:  Awake, spastic musculature  HEENT:  Trach size #8 Cardiovascular:  Irreg irreg Lungs:  Rhonchi bases bilaterally Abdomen:  BS+, PEG Musculoskeletal:  Minimal bulk, spastic LUE Skin:  Thin, multiple bruises throughout   Recent Labs Lab 07/05/14 0848 07/07/14 0848 07/09/14 0628  NA 143 143 141  K 4.1 4.3 4.9  CL 103 101 97  CO2 32 32 32  BUN 35* 43* 50*  CREATININE 0.84 0.89 0.91  GLUCOSE 100* 81 92    Recent Labs Lab 07/05/14 0848 07/07/14 0848 07/09/14 0628  HGB 8.8* 8.9* 9.0*  HCT 28.6* 29.5* 29.4*  WBC 16.1* 18.0* 21.8*  PLT 261 279 262   Dg Chest Port 1 View  07/09/2014   CLINICAL DATA:  Fever.  EXAM: PORTABLE CHEST - 1 VIEW  COMPARISON:  07/05/2014  FINDINGS: Tracheostomy tube overlies the airway. Single lead pacemaker remains in place. Cardiac silhouette remains moderately enlarged with thoracic aorta calcification noted. Mixed interstitial and airspace opacities in the left greater than right lower lungs do not appear significantly changed, nor do small bilateral pleural effusions. No pneumothorax is identified. Degenerative changes are noted at the right glenohumeral joint.  IMPRESSION: Unchanged  bibasilar opacities suggestive of pulmonary edema although superimposed infection in the left base is not excluded.   Electronically Signed   By: Sebastian AcheAllen  Grady   On: 07/09/2014 10:25    ASSESSMENT / PLAN:  Acute on chronic respiratory failure  Chronic vent dependent > tolerated ATC  Up to 8 hours this admission, but worse with HCAP HCAP - stenotrophomonas, yeast per OSH  Acute on chronic mixed CHF Hx PAF New Fever > per ID  Plan -  Cont vent support on home settings today Lasix 40mg  IV x1 11/5 PRN BD + pulmicort BID Could attempt ATC trials again once ID issues resolved PT/OT  HR control  Treat HCAP as your are doing   Heber CarolinaBrent McQuaid, MD [place] [facility] Pager: [phone] Cell: [phone] If no response, call [phone]

## 2014-07-10 ENCOUNTER — Other Ambulatory Visit (HOSPITAL_COMMUNITY): Payer: MEDICARE

## 2014-07-10 LAB — C-REACTIVE PROTEIN: CRP: 26.8 mg/dL — AB (ref <0.60)

## 2014-07-10 LAB — BASIC METABOLIC PANEL
Anion gap: 15 (ref 5–15)
BUN: 58 mg/dL — AB (ref 6–23)
CO2: 31 mEq/L (ref 19–32)
CREATININE: 1.03 mg/dL (ref 0.50–1.35)
Calcium: 9.3 mg/dL (ref 8.4–10.5)
Chloride: 94 mEq/L — ABNORMAL LOW (ref 96–112)
GFR calc Af Amer: 71 mL/min — ABNORMAL LOW (ref >90)
GFR, EST NON AFRICAN AMERICAN: 61 mL/min — AB (ref >90)
Glucose, Bld: 117 mg/dL — ABNORMAL HIGH (ref 70–99)
Potassium: 4.9 mEq/L (ref 3.7–5.3)
Sodium: 140 mEq/L (ref 137–147)

## 2014-07-10 LAB — CBC WITH DIFFERENTIAL/PLATELET
BLASTS: 0 %
Band Neutrophils: 11 % — ABNORMAL HIGH (ref 0–10)
Basophils Absolute: 0 10*3/uL (ref 0.0–0.1)
Basophils Relative: 0 % (ref 0–1)
EOS ABS: 1 10*3/uL — AB (ref 0.0–0.7)
EOS PCT: 3 % (ref 0–5)
HCT: 35.3 % — ABNORMAL LOW (ref 39.0–52.0)
Hemoglobin: 10.6 g/dL — ABNORMAL LOW (ref 13.0–17.0)
Lymphocytes Relative: 1 % — ABNORMAL LOW (ref 12–46)
Lymphs Abs: 0.3 10*3/uL — ABNORMAL LOW (ref 0.7–4.0)
MCH: 26.5 pg (ref 26.0–34.0)
MCHC: 30 g/dL (ref 30.0–36.0)
MCV: 88.3 fL (ref 78.0–100.0)
MYELOCYTES: 0 %
Metamyelocytes Relative: 2 %
Monocytes Absolute: 1.4 10*3/uL — ABNORMAL HIGH (ref 0.1–1.0)
Monocytes Relative: 4 % (ref 3–12)
Neutro Abs: 32.1 10*3/uL — ABNORMAL HIGH (ref 1.7–7.7)
Neutrophils Relative %: 78 % — ABNORMAL HIGH (ref 43–77)
PLATELETS: 354 10*3/uL (ref 150–400)
Promyelocytes Absolute: 1 %
RBC: 4 MIL/uL — ABNORMAL LOW (ref 4.22–5.81)
RDW: 19.1 % — AB (ref 11.5–15.5)
WBC: 34.8 10*3/uL — ABNORMAL HIGH (ref 4.0–10.5)
nRBC: 0 /100 WBC

## 2014-07-10 LAB — SEDIMENTATION RATE: Sed Rate: 70 mm/hr — ABNORMAL HIGH (ref 0–16)

## 2014-07-11 LAB — BASIC METABOLIC PANEL
ANION GAP: 13 (ref 5–15)
BUN: 68 mg/dL — ABNORMAL HIGH (ref 6–23)
CALCIUM: 8.7 mg/dL (ref 8.4–10.5)
CO2: 33 mEq/L — ABNORMAL HIGH (ref 19–32)
Chloride: 98 mEq/L (ref 96–112)
Creatinine, Ser: 1.04 mg/dL (ref 0.50–1.35)
GFR, EST AFRICAN AMERICAN: 70 mL/min — AB (ref >90)
GFR, EST NON AFRICAN AMERICAN: 61 mL/min — AB (ref >90)
GLUCOSE: 86 mg/dL (ref 70–99)
Potassium: 4.8 mEq/L (ref 3.7–5.3)
SODIUM: 144 meq/L (ref 137–147)

## 2014-07-11 LAB — CBC WITH DIFFERENTIAL/PLATELET
BASOS ABS: 0 10*3/uL (ref 0.0–0.1)
BASOS PCT: 0 % (ref 0–1)
EOS ABS: 0.2 10*3/uL (ref 0.0–0.7)
EOS PCT: 1 % (ref 0–5)
HCT: 28.7 % — ABNORMAL LOW (ref 39.0–52.0)
Hemoglobin: 8.6 g/dL — ABNORMAL LOW (ref 13.0–17.0)
Lymphocytes Relative: 9 % — ABNORMAL LOW (ref 12–46)
Lymphs Abs: 1.7 10*3/uL (ref 0.7–4.0)
MCH: 26.7 pg (ref 26.0–34.0)
MCHC: 30 g/dL (ref 30.0–36.0)
MCV: 89.1 fL (ref 78.0–100.0)
Monocytes Absolute: 1.1 10*3/uL — ABNORMAL HIGH (ref 0.1–1.0)
Monocytes Relative: 6 % (ref 3–12)
Neutro Abs: 15.8 10*3/uL — ABNORMAL HIGH (ref 1.7–7.7)
Neutrophils Relative %: 84 % — ABNORMAL HIGH (ref 43–77)
PLATELETS: 250 10*3/uL (ref 150–400)
RBC: 3.22 MIL/uL — AB (ref 4.22–5.81)
RDW: 19.2 % — AB (ref 11.5–15.5)
WBC: 18.8 10*3/uL — ABNORMAL HIGH (ref 4.0–10.5)

## 2014-07-11 LAB — PROCALCITONIN: PROCALCITONIN: 1.31 ng/mL

## 2014-07-11 LAB — MAGNESIUM: MAGNESIUM: 2.6 mg/dL — AB (ref 1.5–2.5)

## 2014-07-11 LAB — C-REACTIVE PROTEIN: CRP: 22 mg/dL — ABNORMAL HIGH (ref <0.60)

## 2014-07-12 LAB — CBC
HEMATOCRIT: 30.9 % — AB (ref 39.0–52.0)
HEMOGLOBIN: 9.2 g/dL — AB (ref 13.0–17.0)
MCH: 26.2 pg (ref 26.0–34.0)
MCHC: 29.8 g/dL — AB (ref 30.0–36.0)
MCV: 88 fL (ref 78.0–100.0)
Platelets: 330 10*3/uL (ref 150–400)
RBC: 3.51 MIL/uL — AB (ref 4.22–5.81)
RDW: 19.2 % — ABNORMAL HIGH (ref 11.5–15.5)
WBC: 23.5 10*3/uL — ABNORMAL HIGH (ref 4.0–10.5)

## 2014-07-13 ENCOUNTER — Other Ambulatory Visit (HOSPITAL_COMMUNITY): Payer: MEDICARE

## 2014-07-13 DIAGNOSIS — Z930 Tracheostomy status: Secondary | ICD-10-CM

## 2014-07-13 DIAGNOSIS — J9621 Acute and chronic respiratory failure with hypoxia: Secondary | ICD-10-CM

## 2014-07-13 DIAGNOSIS — J690 Pneumonitis due to inhalation of food and vomit: Secondary | ICD-10-CM

## 2014-07-13 LAB — BASIC METABOLIC PANEL
ANION GAP: 12 (ref 5–15)
BUN: 80 mg/dL — ABNORMAL HIGH (ref 6–23)
CALCIUM: 8.6 mg/dL (ref 8.4–10.5)
CHLORIDE: 105 meq/L (ref 96–112)
CO2: 34 mEq/L — ABNORMAL HIGH (ref 19–32)
CREATININE: 1.11 mg/dL (ref 0.50–1.35)
GFR calc Af Amer: 65 mL/min — ABNORMAL LOW (ref >90)
GFR calc non Af Amer: 56 mL/min — ABNORMAL LOW (ref >90)
Glucose, Bld: 108 mg/dL — ABNORMAL HIGH (ref 70–99)
Potassium: 4.5 mEq/L (ref 3.7–5.3)
Sodium: 151 mEq/L — ABNORMAL HIGH (ref 137–147)

## 2014-07-13 LAB — CBC
HEMATOCRIT: 29.6 % — AB (ref 39.0–52.0)
Hemoglobin: 8.9 g/dL — ABNORMAL LOW (ref 13.0–17.0)
MCH: 26.6 pg (ref 26.0–34.0)
MCHC: 30.1 g/dL (ref 30.0–36.0)
MCV: 88.4 fL (ref 78.0–100.0)
PLATELETS: 260 10*3/uL (ref 150–400)
RBC: 3.35 MIL/uL — AB (ref 4.22–5.81)
RDW: 19.7 % — ABNORMAL HIGH (ref 11.5–15.5)
WBC: 25.5 10*3/uL — ABNORMAL HIGH (ref 4.0–10.5)

## 2014-07-13 LAB — PROCALCITONIN: Procalcitonin: 1.22 ng/mL

## 2014-07-13 LAB — CULTURE, RESPIRATORY

## 2014-07-13 LAB — VANCOMYCIN, TROUGH: Vancomycin Tr: 21.1 ug/mL — ABNORMAL HIGH (ref 10.0–20.0)

## 2014-07-13 LAB — CULTURE, RESPIRATORY W GRAM STAIN

## 2014-07-13 NOTE — Progress Notes (Signed)
   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  He was treated with diuresis, abx and tx to [facility] 10/31.  PCCM consulted to assist with vent management.   SIGNIFICANT EVENTS    STUDIES:    SUBJECTIVE: Fever this week, failed ATC attempts  VITAL SIGNS:   Temp 99 HR 82 BP 102/47 RR 21 O2 99% on vent  Vent: ACVC Rate 20 TVol 450, flow rate 4480mL/min, PEEP 5, FiO2 40%   PHYSICAL EXAMINATION: General:  No acute distress on vent Neuro:  Awake, spastic musculature  HEENT:  Trach size #8 Cardiovascular:  Irreg irreg Lungs:  Rhonchi bases bilaterally Abdomen:  BS+, PEG Musculoskeletal:  Minimal bulk, spastic LUE Skin:  Thin, multiple bruises throughout   Recent Labs Lab 07/10/14 1303 07/11/14 0558 07/13/14 0501  NA 140 144 151*  K 4.9 4.8 4.5  CL 94* 98 105  CO2 31 33* 34*  BUN 58* 68* 80*  CREATININE 1.03 1.04 1.11  GLUCOSE 117* 86 108*    Recent Labs Lab 07/11/14 0558 07/12/14 0730 07/13/14 0501  HGB 8.6* 9.2* 8.9*  HCT 28.7* 30.9* 29.6*  WBC 18.8* 23.5* 25.5*  PLT 250 330 260   Dg Chest Port 1 View  07/13/2014   CLINICAL DATA:  Respiratory failure.  EXAM: PORTABLE CHEST - 1 VIEW  COMPARISON:  07/10/2014.  FINDINGS: Tracheostomy tube and right IJ line in stable position. Cardiac pacer stable position. Severe cardiomegaly again noted. Pulmonary vascularity is normal. Bibasilar infiltrates are present and improved slightly from prior exam. Small bilateral pleural effusions are present. No pneumothorax.  IMPRESSION: 1. Lines and tubes in stable position. 2. Severe state cardiomegaly.  Pulmonary vascularity is normal. 3. Persistent but  improved bibasilar infiltrates. Bilateral small pleural effusions are present.   Electronically Signed   By: Maisie Fushomas  Register   On: 07/13/2014 07:06    ASSESSMENT / PLAN:  Acute on chronic respiratory failure  Chronic vent dependent > tolerated ATC  Up to 8 hours this admission, but worse with HCAP HCAP - stenotrophomonas, yeast per OSH  Acute on chronic mixed CHF Hx PAF New Fever > per ID Hemoptysis 11/8 on Lovenox  ESCOPD on amiodarone PAF on amio/dig but on xopenex 1.25 mg  Plan -  Cont vent support on home settings  Stop deep suctioning, dc lovenox for now and use SCD(discussed with Hijazi) PRN BD + pulmicort BID Could attempt ATC trials again once ID issues resolved PT/OT  HR control with other drug than amiodarone. Decrease xopenex to 0.63([facility]) Treat HCAP as your are doing  Brett CanalesSteve Minor ACNP [facility] Pager [phone] till 3 pm If no answer page [phone] 07/13/2014, 9:15 AM  Continue home vent settings.  Given bloody secretion would recommend discontinuation of deep suctioning and hold anti-coagulants.  Hold TC trials for now and monitor.  PCCM will continue to follow.  Patient seen and examined, agree with above note.  I dictated the care and orders written for this patient under my direction.  Alyson ReedyWesam G Yacoub, MD [phone]

## 2014-07-14 LAB — BASIC METABOLIC PANEL
Anion gap: 13 (ref 5–15)
BUN: 66 mg/dL — AB (ref 6–23)
CO2: 31 meq/L (ref 19–32)
Calcium: 8.5 mg/dL (ref 8.4–10.5)
Chloride: 103 mEq/L (ref 96–112)
Creatinine, Ser: 0.97 mg/dL (ref 0.50–1.35)
GFR calc Af Amer: 81 mL/min — ABNORMAL LOW (ref >90)
GFR, EST NON AFRICAN AMERICAN: 70 mL/min — AB (ref >90)
GLUCOSE: 98 mg/dL (ref 70–99)
POTASSIUM: 4.7 meq/L (ref 3.7–5.3)
Sodium: 147 mEq/L (ref 137–147)

## 2014-07-14 LAB — VANCOMYCIN, RANDOM: Vancomycin Rm: 21.8 ug/mL

## 2014-07-15 LAB — CULTURE, BLOOD (ROUTINE X 2)
CULTURE: NO GROWTH
Culture: NO GROWTH

## 2014-07-16 ENCOUNTER — Other Ambulatory Visit (HOSPITAL_COMMUNITY): Payer: MEDICARE

## 2014-07-16 DIAGNOSIS — R042 Hemoptysis: Secondary | ICD-10-CM

## 2014-07-16 DIAGNOSIS — J9610 Chronic respiratory failure, unspecified whether with hypoxia or hypercapnia: Secondary | ICD-10-CM

## 2014-07-16 DIAGNOSIS — R0902 Hypoxemia: Secondary | ICD-10-CM

## 2014-07-16 LAB — BASIC METABOLIC PANEL
Anion gap: 10 (ref 5–15)
BUN: 74 mg/dL — AB (ref 6–23)
CHLORIDE: 104 meq/L (ref 96–112)
CO2: 33 meq/L — AB (ref 19–32)
CREATININE: 1.14 mg/dL (ref 0.50–1.35)
Calcium: 8.4 mg/dL (ref 8.4–10.5)
GFR calc Af Amer: 63 mL/min — ABNORMAL LOW (ref >90)
GFR calc non Af Amer: 54 mL/min — ABNORMAL LOW (ref >90)
Glucose, Bld: 87 mg/dL (ref 70–99)
POTASSIUM: 4.9 meq/L (ref 3.7–5.3)
Sodium: 147 mEq/L (ref 137–147)

## 2014-07-16 LAB — CULTURE, BLOOD (ROUTINE X 2)
CULTURE: NO GROWTH
Culture: NO GROWTH

## 2014-07-16 LAB — CBC
HEMATOCRIT: 27 % — AB (ref 39.0–52.0)
Hemoglobin: 8.2 g/dL — ABNORMAL LOW (ref 13.0–17.0)
MCH: 27.4 pg (ref 26.0–34.0)
MCHC: 30.4 g/dL (ref 30.0–36.0)
MCV: 90.3 fL (ref 78.0–100.0)
Platelets: 199 10*3/uL (ref 150–400)
RBC: 2.99 MIL/uL — ABNORMAL LOW (ref 4.22–5.81)
RDW: 21.2 % — AB (ref 11.5–15.5)
WBC: 34.1 10*3/uL — AB (ref 4.0–10.5)

## 2014-07-16 LAB — PROCALCITONIN: Procalcitonin: 0.77 ng/mL

## 2014-07-16 NOTE — Progress Notes (Signed)
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  He was treated with diuresis, abx and tx to [facility] 10/31.  PCCM consulted to assist with vent management.   SIGNIFICANT EVENTS    STUDIES:    SUBJECTIVE:  Not in acute distress.   VITAL SIGNS: Reviewed   Vent: ACVC Rate 20 TVol 450, flow rate 4980mL/min, PEEP 5, FiO2 40%   PHYSICAL EXAMINATION: General:  No acute distress on vent Neuro:  Awake, spastic musculature  HEENT:  Trach size #8 Cardiovascular:  Irreg irreg Lungs:  Rhonchi bases bilaterally, unchanged  Abdomen:  BS+, PEG Musculoskeletal:  Minimal bulk, spastic LUE Skin:  Thin, multiple bruises throughout   Recent Labs Lab 07/13/14 0501 07/14/14 0815 07/16/14 0500  NA 151* 147 147  K 4.5 4.7 4.9  CL 105 103 104  CO2 34* 31 33*  BUN 80* 66* 74*  CREATININE 1.11 0.97 1.14  GLUCOSE 108* 98 87    Recent Labs Lab 07/12/14 0730 07/13/14 0501 07/16/14 0500  HGB 9.2* 8.9* 8.2*  HCT 30.9* 29.6* 27.0*  WBC 23.5* 25.5* 34.1*  PLT 330 260 199   Dg Chest Port 1 View  07/16/2014   CLINICAL DATA:  Pneumonia.  EXAM: PORTABLE CHEST - 1 VIEW  COMPARISON:  07/13/2014.  FINDINGS: Tracheostomy and right IJ line in stable position. Cardiac pacer in stable position. Stable severe cardiomegaly and thoracic aortic ectasia. No pulmonary venous distention. Stable bibasilar pulmonary infiltrates and small bilateral pleural effusions. These changes may be related to pneumonia and/or pulmonary edema. No pneumothorax. No acute osseous abnormality. Degenerative changes thoracic spine and both shoulders.  IMPRESSION: 1. Lines and tubes in stable position. 2.  Stable severe cardiomegaly and thoracic aortic ectasia. No pulmonary venous congestion. Cardiac pacer in stable position . 3. Stable bibasilar pulmonary infiltrates a small bilateral pleural effusions. These changes may be related to pneumonia and/or pulmonary edema .   Electronically Signed   By: Maisie Fushomas  Register   On: 07/16/2014 07:40    ASSESSMENT / PLAN:  Acute on chronic respiratory failure  Chronic vent dependent > tolerated ATC  Up to 8 hours this admission, but worse with HCAP HCAP - stenotrophomonas, yeast per OSH  Hemoptysis 11/8 thru 11/12 Acute on chronic mixed CHF Hx PAF New Fever > per ID ESCOPD on amiodarone PAF on amio/dig but on xopenex 1.25 mg  Discussion Has had 3 trach changes. After last change has had ~ 3 d of persistent tracheal bleeding. At this point it is unclear if this is tracheal trauma OR true hemoptysis. This has been persistent even after lovenox was stopped.   Plan -  Cont vent support on home settings  Stop deep suctioning, dc'dovenox for now and use SCD(discussed with Hijazi) We will eval via FOB at bedside.  PRN BD + pulmicort BID PT/OT  HR control with other drug than amiodarone. Decrease xopenex to 0.63 ([facility]) Treat HCAP as your are doing  Could attempt ATC trials again once ID issues resolved  07/16/2014, 12:31 PM Simonne MartinetPeter E Babcock, NP  Will likely need a bronchoscopy to evaluate bleeding if it continues.  In the meantime, continue with  home vent at this time.  No further anticoagulation and no further deep suctioning.  Patient seen and examined, agree with above note.  I dictated the care and orders written for this patient under my direction.  Alyson ReedyWesam G Yacoub, MD [phone]

## 2014-07-17 LAB — CLOSTRIDIUM DIFFICILE BY PCR: Toxigenic C. Difficile by PCR: NEGATIVE

## 2014-07-18 LAB — CBC
HEMATOCRIT: 25.2 % — AB (ref 39.0–52.0)
Hemoglobin: 7.7 g/dL — ABNORMAL LOW (ref 13.0–17.0)
MCH: 28 pg (ref 26.0–34.0)
MCHC: 30.6 g/dL (ref 30.0–36.0)
MCV: 91.6 fL (ref 78.0–100.0)
PLATELETS: 180 10*3/uL (ref 150–400)
RBC: 2.75 MIL/uL — AB (ref 4.22–5.81)
RDW: 22.5 % — AB (ref 11.5–15.5)
WBC: 22 10*3/uL — ABNORMAL HIGH (ref 4.0–10.5)

## 2014-07-18 LAB — BASIC METABOLIC PANEL
ANION GAP: 12 (ref 5–15)
BUN: 79 mg/dL — AB (ref 6–23)
CHLORIDE: 104 meq/L (ref 96–112)
CO2: 30 mEq/L (ref 19–32)
CREATININE: 1.47 mg/dL — AB (ref 0.50–1.35)
Calcium: 8.3 mg/dL — ABNORMAL LOW (ref 8.4–10.5)
GFR calc non Af Amer: 40 mL/min — ABNORMAL LOW (ref >90)
GFR, EST AFRICAN AMERICAN: 46 mL/min — AB (ref >90)
Glucose, Bld: 94 mg/dL (ref 70–99)
POTASSIUM: 5.1 meq/L (ref 3.7–5.3)
Sodium: 146 mEq/L (ref 137–147)

## 2014-07-19 ENCOUNTER — Other Ambulatory Visit (HOSPITAL_COMMUNITY): Payer: MEDICARE

## 2014-07-19 LAB — CBC
HCT: 24.7 % — ABNORMAL LOW (ref 39.0–52.0)
Hemoglobin: 7.6 g/dL — ABNORMAL LOW (ref 13.0–17.0)
MCH: 27.6 pg (ref 26.0–34.0)
MCHC: 30.8 g/dL (ref 30.0–36.0)
MCV: 89.8 fL (ref 78.0–100.0)
Platelets: 175 10*3/uL (ref 150–400)
RBC: 2.75 MIL/uL — AB (ref 4.22–5.81)
RDW: 22.2 % — ABNORMAL HIGH (ref 11.5–15.5)
WBC: 20.3 10*3/uL — ABNORMAL HIGH (ref 4.0–10.5)

## 2014-07-20 LAB — CBC
HEMATOCRIT: 25.5 % — AB (ref 39.0–52.0)
HEMOGLOBIN: 8 g/dL — AB (ref 13.0–17.0)
MCH: 28.1 pg (ref 26.0–34.0)
MCHC: 31.4 g/dL (ref 30.0–36.0)
MCV: 89.5 fL (ref 78.0–100.0)
Platelets: 190 10*3/uL (ref 150–400)
RBC: 2.85 MIL/uL — ABNORMAL LOW (ref 4.22–5.81)
RDW: 22.3 % — ABNORMAL HIGH (ref 11.5–15.5)
WBC: 20.5 10*3/uL — AB (ref 4.0–10.5)

## 2014-07-20 LAB — BASIC METABOLIC PANEL
Anion gap: 13 (ref 5–15)
BUN: 75 mg/dL — AB (ref 6–23)
CO2: 28 meq/L (ref 19–32)
Calcium: 8.1 mg/dL — ABNORMAL LOW (ref 8.4–10.5)
Chloride: 101 mEq/L (ref 96–112)
Creatinine, Ser: 1.47 mg/dL — ABNORMAL HIGH (ref 0.50–1.35)
GFR calc Af Amer: 46 mL/min — ABNORMAL LOW (ref >90)
GFR calc non Af Amer: 40 mL/min — ABNORMAL LOW (ref >90)
Glucose, Bld: 83 mg/dL (ref 70–99)
POTASSIUM: 5.6 meq/L — AB (ref 3.7–5.3)
SODIUM: 142 meq/L (ref 137–147)

## 2014-07-20 NOTE — Progress Notes (Signed)
Name: Tanner Wu MRN: [id number] DOB: 1921/04/20    ADMISSION DATE:  07/04/2014 CONSULTATION DATE:  11/2  REFERRING MD :  Hijazi  CHIEF COMPLAINT:  Vent mgmt   BRIEF PATIENT DESCRIPTION: 80 yo male with chronic resp failure on home vent r/t C2-C3 quadriplegia, COPD, recurrent UTI initially admitted to outside hospital 10/29 with HCAP and acute on chronic mixed CHF.  He was treated with diuresis, abx and tx to [facility] 10/31.  PCCM consulted to assist with vent management.   SIGNIFICANT EVENTS    STUDIES:    SUBJECTIVE:  Not in acute distress.   VITAL SIGNS: Reviewed     PHYSICAL EXAMINATION: General:  No acute distress on vent. Air hungry even when asleep. General wasting away. Neuro:  Awake, spastic musculature  HEENT:  Trach size #8 Cardiovascular:  Irreg irreg Lungs:  Rhonchi bases bilaterally, unchanged  Abdomen:  BS+, PEG Musculoskeletal:  Minimal bulk, spastic LUE Skin:  Thin, multiple bruises throughout, multiple areas of skin breakdown.   Recent Labs Lab 07/16/14 0500 07/18/14 0530 07/20/14 0500  NA 147 146 142  K 4.9 5.1 5.6*  CL 104 104 101  CO2 33* 30 28  BUN 74* 79* 75*  CREATININE 1.14 1.47* 1.47*  GLUCOSE 87 94 83    Recent Labs Lab 07/16/14 0500 07/18/14 0530 07/19/14 0504  HGB 8.2* 7.7* 7.6*  HCT 27.0* 25.2* 24.7*  WBC 34.1* 22.0* 20.3*  PLT 199 180 175   Dg Chest Port 1 View  07/19/2014   CLINICAL DATA:  Respiratory failure  EXAM: PORTABLE CHEST - 1 VIEW  COMPARISON:  07/16/2014  FINDINGS: Cardiomegaly again noted. Stable tracheostomy tube position. Stable single lead cardiac pacemaker position. Slight improved aeration. Persistent bilateral basilar basilar atelectasis or infiltrate. No pulmonary edema. Stable right IJ central line position. Degenerative changes bilateral shoulders.  IMPRESSION: Stable support apparatus. Slight improved aeration. No pulmonary edema. Residual bilateral basilar atelectasis or infiltrate.    Electronically Signed   By: Natasha MeadLiviu  Pop M.D.   On: 07/19/2014 11:09    ASSESSMENT / PLAN:  Acute on chronic respiratory failure  Chronic vent dependent > tolerated ATC  Up to 8 hours this admission, but worse with HCAP HCAP - stenotrophomonas, yeast per OSH  Hemoptysis 11/8 thru 11/12, resolving 11/16 Acute on chronic mixed CHF Hx PAF New Fever > per ID ESCOPD on amiodarone PAF  Discussion Has had 3 trach changes. After last change has had ~ 3 d of persistent tracheal bleeding. At this point it is unclear if this is tracheal trauma OR true hemoptysis. This has been persistent even after lovenox was stopped.   Plan -  Cont vent support on home settings  Stop deep suctioning, dc'dovenox for now and use SCD(discussed with Hijazi) We will eval via FOB at bedside if bleeding worsens, not needed currently. PRN BD + pulmicort BID PT/OT  HR control with other drug than amiodarone. Decrease xopenex to 0.63 ([facility]) Treat HCAP as your are doing  Could attempt ATC trials again once ID issues resolved but suspect he is near end stage.   Brett CanalesSteve Minor ACNP [facility] Pager [phone] till 3 pm If no answer page [phone] 07/20/2014, 9:31 AM  Bleeding slightly improved, if worsens will consider bronch, avoid anti-coagulation and more importantly deep suction, would not go beyond the length of the tracheostomy.  Treat infection as above.  HR control [facility], avoid amio as patient already has very little reserve.  Will continue to follow with you.  Patient seen and examined, agree with above note.  I dictated the care and orders written for this patient under my direction.  Alyson ReedyWesam G Yacoub, MD [phone]

## 2014-07-21 LAB — POTASSIUM: Potassium: 4.9 mEq/L (ref 3.7–5.3)

## 2014-07-22 LAB — URINALYSIS, ROUTINE W REFLEX MICROSCOPIC
BILIRUBIN URINE: NEGATIVE
GLUCOSE, UA: NEGATIVE mg/dL
KETONES UR: NEGATIVE mg/dL
Nitrite: NEGATIVE
Protein, ur: NEGATIVE mg/dL
Specific Gravity, Urine: 1.015 (ref 1.005–1.030)
Urobilinogen, UA: 0.2 mg/dL (ref 0.0–1.0)
pH: 7 (ref 5.0–8.0)

## 2014-07-22 LAB — BASIC METABOLIC PANEL
Anion gap: 8 (ref 5–15)
BUN: 69 mg/dL — ABNORMAL HIGH (ref 6–23)
CHLORIDE: 105 meq/L (ref 96–112)
CO2: 29 meq/L (ref 19–32)
Calcium: 8.1 mg/dL — ABNORMAL LOW (ref 8.4–10.5)
Creatinine, Ser: 1.35 mg/dL (ref 0.50–1.35)
GFR calc Af Amer: 51 mL/min — ABNORMAL LOW (ref >90)
GFR calc non Af Amer: 44 mL/min — ABNORMAL LOW (ref >90)
Glucose, Bld: 79 mg/dL (ref 70–99)
POTASSIUM: 5.4 meq/L — AB (ref 3.7–5.3)
Sodium: 142 mEq/L (ref 137–147)

## 2014-07-22 LAB — URINE MICROSCOPIC-ADD ON

## 2014-07-23 ENCOUNTER — Other Ambulatory Visit (HOSPITAL_COMMUNITY): Payer: MEDICARE

## 2014-07-23 DIAGNOSIS — J9620 Acute and chronic respiratory failure, unspecified whether with hypoxia or hypercapnia: Secondary | ICD-10-CM

## 2014-07-23 DIAGNOSIS — J449 Chronic obstructive pulmonary disease, unspecified: Secondary | ICD-10-CM

## 2014-07-23 LAB — BASIC METABOLIC PANEL
Anion gap: 11 (ref 5–15)
BUN: 66 mg/dL — AB (ref 6–23)
CALCIUM: 8 mg/dL — AB (ref 8.4–10.5)
CO2: 28 meq/L (ref 19–32)
Chloride: 104 mEq/L (ref 96–112)
Creatinine, Ser: 1.25 mg/dL (ref 0.50–1.35)
GFR calc Af Amer: 56 mL/min — ABNORMAL LOW (ref >90)
GFR calc non Af Amer: 49 mL/min — ABNORMAL LOW (ref >90)
Glucose, Bld: 98 mg/dL (ref 70–99)
Potassium: 5.1 mEq/L (ref 3.7–5.3)
SODIUM: 143 meq/L (ref 137–147)

## 2014-07-23 LAB — PREPARE RBC (CROSSMATCH)

## 2014-07-23 LAB — CBC
HCT: 25.7 % — ABNORMAL LOW (ref 39.0–52.0)
Hemoglobin: 7.8 g/dL — ABNORMAL LOW (ref 13.0–17.0)
MCH: 27 pg (ref 26.0–34.0)
MCHC: 30.4 g/dL (ref 30.0–36.0)
MCV: 88.9 fL (ref 78.0–100.0)
Platelets: 183 10*3/uL (ref 150–400)
RBC: 2.89 MIL/uL — ABNORMAL LOW (ref 4.22–5.81)
RDW: 23.7 % — ABNORMAL HIGH (ref 11.5–15.5)
WBC: 18.3 10*3/uL — AB (ref 4.0–10.5)

## 2014-07-23 LAB — ABO/RH: ABO/RH(D): O POS

## 2014-07-23 LAB — URINE CULTURE: Colony Count: >=100000

## 2014-07-23 NOTE — Progress Notes (Signed)
Name: Tanner Wu MRN: [id number] DOB: 12/01/1920    ADMISSION DATE:  07/04/2014 CONSULTATION DATE:  11/2  REFERRING MD :  Hijazi  CHIEF COMPLAINT:  Vent mgmt   BRIEF PATIENT DESCRIPTION: 80 yo male with chronic resp failure on home vent r/t C2-C3 quadriplegia, COPD, recurrent UTI initially admitted to outside hospital 10/29 with HCAP and acute on chronic mixed CHF.  He was treated with diuresis, abx and tx to [facility] 10/[facility].  PCCM consulted to assist with vent management.   SIGNIFICANT EVENTS    STUDIES:    SUBJECTIVE:  NSC   VITAL SIGNS: Vital signs reviewed. Abnormal values will appear under impression plan section.       PHYSICAL EXAMINATION: General:  No acute distress on vent. Air hungry on vent. General wasting away. Neuro:  Awake, spastic musculature  HEENT:  Trach size #8, no blood in trach Cardiovascular:  Irreg irreg Lungs:  Rhonchi bases bilaterally, unchanged  Abdomen:  BS+, PEG Musculoskeletal:  Minimal bulk, spastic LUE Skin:  Thin, multiple bruises throughout, multiple areas of skin breakdown.   Recent Labs Lab 07/20/14 0500 07/21/14 0500 07/22/14 0540 07/23/14 0615  NA 142  --  142 143  K 5.6* 4.9 5.4* 5.1  CL 101  --  105 104  CO2 28  --  29 28  BUN 75*  --  69* 66*  CREATININE 1.47*  --  1.35 1.25  GLUCOSE 83  --  79 98    Recent Labs Lab 07/19/14 0504 07/20/14 1339 07/23/14 0615  HGB 7.6* 8.0* 7.8*  HCT 24.7* 25.5* 25.7*  WBC 20.3* 20.5* 18.3*  PLT 175 190 183   Dg Chest Port 1 View  07/23/2014   CLINICAL DATA:  80 year old male with respiratory failure, tracheostomy, fever pneumonia. Initial encounter.  EXAM: PORTABLE CHEST - 1 VIEW  COMPARISON:  07/19/2014 and earlier.  FINDINGS: Portable AP semi upright view at 0542 hrs. Stable tracheostomy tube. Stable right chest cardiac pacemaker. Stable cardiomegaly and mediastinal contours.  Lung volumes and ventilation have not significantly changed since 07/16/2014. Patchy bibasilar  pulmonary opacity with superimposed small pleural effusions. No pneumothorax.  IMPRESSION: No significant change since 07/16/2014. Patchy bibasilar opacity, small pleural effusions superimposed on cardiomegaly.   Electronically Signed   By: Augusto GambleLee  Hall M.D.   On: 07/23/2014 07:43    ASSESSMENT / PLAN:  Acute on chronic respiratory failure  Chronic vent dependent > tolerated ATC  Up to 8 hours this admission, but worse with HCAP HCAP - stenotrophomonas, yeast per OSH  Hemoptysis 11/8 thru 11/12, resolving 11/16 Acute on chronic mixed CHF Hx PAF New Fever > per ID ESCOPD on amiodarone PAF  Discussion Has had 3 trach changes. After last change has had ~ 3 d of persistent tracheal bleeding. At this point it is unclear if this is tracheal trauma OR true hemoptysis. This has been persistent even after lovenox was stopped. Bleeding is better  Plan -  Cont vent support on home settings  Stop deep suctioning, dc'dovenox for now and use SCD(discussed with Hijazi) We will eval via FOB at bedside if bleeding worsens, not needed currently. PRN BD + pulmicort BID PT/OT  HR control with other drug than amiodarone. Decrease xopenex to 0.63 Treat HCAP as your are doing  Could attempt ATC trials again once ID issues resolved but suspect he is near end stage.   Brett CanalesSteve Minor ACNP [facility] Pager [phone] till 3 pm If no answer page [phone] 07/23/2014, 9:39 AM

## 2014-07-24 LAB — CBC
HEMATOCRIT: 31.9 % — AB (ref 39.0–52.0)
Hemoglobin: 9.8 g/dL — ABNORMAL LOW (ref 13.0–17.0)
MCH: 26.2 pg (ref 26.0–34.0)
MCHC: 30.7 g/dL (ref 30.0–36.0)
MCV: 85.3 fL (ref 78.0–100.0)
Platelets: 187 10*3/uL (ref 150–400)
RBC: 3.74 MIL/uL — ABNORMAL LOW (ref 4.22–5.81)
RDW: 24.4 % — AB (ref 11.5–15.5)
WBC: 20 10*3/uL — ABNORMAL HIGH (ref 4.0–10.5)

## 2014-07-25 ENCOUNTER — Other Ambulatory Visit (HOSPITAL_COMMUNITY): Payer: MEDICARE

## 2014-07-25 LAB — TYPE AND SCREEN
ABO/RH(D): O POS
Antibody Screen: NEGATIVE
Unit division: 0

## 2014-07-26 LAB — BASIC METABOLIC PANEL
ANION GAP: 12 (ref 5–15)
BUN: 46 mg/dL — ABNORMAL HIGH (ref 6–23)
CALCIUM: 8.1 mg/dL — AB (ref 8.4–10.5)
CO2: 27 mEq/L (ref 19–32)
CREATININE: 1.04 mg/dL (ref 0.50–1.35)
Chloride: 99 mEq/L (ref 96–112)
GFR calc non Af Amer: 61 mL/min — ABNORMAL LOW (ref >90)
GFR, EST AFRICAN AMERICAN: 70 mL/min — AB (ref >90)
Glucose, Bld: 112 mg/dL — ABNORMAL HIGH (ref 70–99)
Potassium: 5.1 mEq/L (ref 3.7–5.3)
Sodium: 138 mEq/L (ref 137–147)

## 2014-07-26 LAB — CBC
HCT: 30.6 % — ABNORMAL LOW (ref 39.0–52.0)
Hemoglobin: 9.4 g/dL — ABNORMAL LOW (ref 13.0–17.0)
MCH: 26.5 pg (ref 26.0–34.0)
MCHC: 30.7 g/dL (ref 30.0–36.0)
MCV: 86.2 fL (ref 78.0–100.0)
PLATELETS: 186 10*3/uL (ref 150–400)
RBC: 3.55 MIL/uL — ABNORMAL LOW (ref 4.22–5.81)
RDW: 24.9 % — AB (ref 11.5–15.5)
WBC: 17.7 10*3/uL — ABNORMAL HIGH (ref 4.0–10.5)

## 2014-07-27 DIAGNOSIS — J189 Pneumonia, unspecified organism: Secondary | ICD-10-CM | POA: Insufficient documentation

## 2014-07-27 DIAGNOSIS — J438 Other emphysema: Secondary | ICD-10-CM

## 2014-07-27 DIAGNOSIS — R140 Abdominal distension (gaseous): Secondary | ICD-10-CM | POA: Insufficient documentation

## 2014-07-27 DIAGNOSIS — J9620 Acute and chronic respiratory failure, unspecified whether with hypoxia or hypercapnia: Secondary | ICD-10-CM

## 2014-07-27 LAB — URINALYSIS, ROUTINE W REFLEX MICROSCOPIC
BILIRUBIN URINE: NEGATIVE
Glucose, UA: NEGATIVE mg/dL
Ketones, ur: NEGATIVE mg/dL
NITRITE: NEGATIVE
PH: 5 (ref 5.0–8.0)
Protein, ur: 100 mg/dL — AB
Specific Gravity, Urine: 1.017 (ref 1.005–1.030)
UROBILINOGEN UA: 0.2 mg/dL (ref 0.0–1.0)

## 2014-07-27 LAB — URINE MICROSCOPIC-ADD ON

## 2014-07-27 LAB — CLOSTRIDIUM DIFFICILE BY PCR: Toxigenic C. Difficile by PCR: NEGATIVE

## 2014-07-27 NOTE — Progress Notes (Signed)
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  He was treated with diuresis, abx and tx to [facility] 10/31.  PCCM consulted to assist with vent management.   SIGNIFICANT EVENTS    STUDIES:    SUBJECTIVE:  NSC   VITAL SIGNS: Vital signs reviewed. Abnormal values will appear under impression plan section.     PHYSICAL EXAMINATION: General:Air hungry on vent. General wasting away. Neuro:  Awake, spastic musculature  HEENT:  Trach size #8, no blood in trach Cardiovascular:  Irreg irreg Lungs:  Marked accessory muscle use   Abdomen:  BS+, PEG Musculoskeletal:  Minimal bulk, spastic LUE Skin:  Thin, multiple bruises throughout, multiple areas of skin breakdown.   Recent Labs Lab 07/22/14 0540 07/23/14 0615 07/26/14 0611  NA 142 143 138  K 5.4* 5.1 5.1  CL 105 104 99  CO2 29 28 27   BUN 69* 66* 46*  CREATININE 1.35 1.25 1.04  GLUCOSE 79 98 112*    Recent Labs Lab 07/23/14 0615 07/24/14 0500 07/26/14 0611  HGB 7.8* 9.8* 9.4*  HCT 25.7* 31.9* 30.6*  WBC 18.3* 20.0* 17.7*  PLT 183 187 186   Dg Abd Portable 1v  07/25/2014   CLINICAL DATA:  Abdominal distention for 3 days. Diarrhea for 2 weeks.  EXAM: PORTABLE ABDOMEN - 1 VIEW  COMPARISON:  07/05/2014  FINDINGS: Gastrostomy tube projects over the stomach. Nonobstructive bowel gas pattern. Gas throughout nondistended colon. No organomegaly or suspicious calcification. No free air.  Chronic densities again noted in the lung bases, similar to prior study.  No acute bony abnormality.  IMPRESSION: No evidence of bowel obstruction or free air.   Electronically Signed   By: Charlett NoseKevin  Dover M.D.   On:  07/25/2014 14:27    ASSESSMENT / PLAN:  Acute on chronic respiratory failure  Chronic vent dependent  (Quad/COPD) > tolerated ATC  Up to 8 hours this admission, but worse with HCAP HCAP - stenotrophomonas, yeast per OSH  Hemoptysis 11/8 thru 11/12, resolving 11/16 Acute on chronic mixed CHF Hx PAF New Fever > per ID ESCOPD on amiodarone PAF Diarrhea   Discussion Has had several trach changes due to air leaks and has had several episodes of trach related trauma and bleeding. He is not Weanable. WBC rising. ? R/t diarrhea. Has sig Work of breathing. Also lots of trouble w/ diarrhea.   Plan -  Cont vent support on home settings Culture urine, sputum and also send Cdiff PCR given new rise in WBC.  stopped deep suctioning, dc'dovenox for now and use SCD(discussed with Hijazi) PRN BD + pulmicort BID PT/OT  HR control with other drug than amiodarone. Decrease xopenex to 0.63  Continued support is medically ineffective and appears painful with no gain for pt She has asked for order for home TC, will NOT write this , could cause harm and cause death, have explained this to her He fails even PS here, can re attempt daily weaning with close observation PS first  Will see weekly  STAFF NOTE: I, Rory Percyaniel Feinstein, MD FACP have personally reviewed patient's available data, including medical history, events of note, physical examination and test results as  part of my evaluation. I have discussed with resident/NP and other care providers such as pharmacist, RN and RRT. In addition, I personally evaluated patient and elicited key findings of: futility of care an issue, failing PS , NO TRACH COLLAR not safe, consider full comfort , pall care consult. Does daughter have a secondary gain? He has no muscle mass and is in pain  Mcarthur RossettiDaniel J. Tyson AliasFeinstein, MD, FACP Pgr: [phone] [facility] 07/27/2014 12:35 PM

## 2014-07-29 LAB — URINE CULTURE
Colony Count: NO GROWTH
Culture: NO GROWTH

## 2014-07-30 LAB — BASIC METABOLIC PANEL
ANION GAP: 11 (ref 5–15)
BUN: 45 mg/dL — ABNORMAL HIGH (ref 6–23)
CO2: 24 mEq/L (ref 19–32)
Calcium: 8.3 mg/dL — ABNORMAL LOW (ref 8.4–10.5)
Chloride: 100 mEq/L (ref 96–112)
Creatinine, Ser: 1.04 mg/dL (ref 0.50–1.35)
GFR, EST AFRICAN AMERICAN: 70 mL/min — AB (ref >90)
GFR, EST NON AFRICAN AMERICAN: 61 mL/min — AB (ref >90)
GLUCOSE: 82 mg/dL (ref 70–99)
POTASSIUM: 5.6 meq/L — AB (ref 3.7–5.3)
SODIUM: 135 meq/L — AB (ref 137–147)

## 2014-07-30 LAB — CULTURE, RESPIRATORY W GRAM STAIN

## 2014-07-30 LAB — CBC
HCT: 30.1 % — ABNORMAL LOW (ref 39.0–52.0)
Hemoglobin: 9.2 g/dL — ABNORMAL LOW (ref 13.0–17.0)
MCH: 27.6 pg (ref 26.0–34.0)
MCHC: 30.6 g/dL (ref 30.0–36.0)
MCV: 90.4 fL (ref 78.0–100.0)
PLATELETS: 161 10*3/uL (ref 150–400)
RBC: 3.33 MIL/uL — ABNORMAL LOW (ref 4.22–5.81)
RDW: 25 % — AB (ref 11.5–15.5)
WBC: 13.7 10*3/uL — ABNORMAL HIGH (ref 4.0–10.5)

## 2014-07-30 LAB — CULTURE, RESPIRATORY: GRAM STAIN: NONE SEEN

## 2014-07-31 LAB — CBC
HEMATOCRIT: 29.3 % — AB (ref 39.0–52.0)
HEMOGLOBIN: 8.7 g/dL — AB (ref 13.0–17.0)
MCH: 26.8 pg (ref 26.0–34.0)
MCHC: 29.7 g/dL — ABNORMAL LOW (ref 30.0–36.0)
MCV: 90.2 fL (ref 78.0–100.0)
Platelets: 161 10*3/uL (ref 150–400)
RBC: 3.25 MIL/uL — AB (ref 4.22–5.81)
RDW: 25.4 % — ABNORMAL HIGH (ref 11.5–15.5)
WBC: 12 10*3/uL — AB (ref 4.0–10.5)

## 2014-07-31 LAB — BASIC METABOLIC PANEL
Anion gap: 12 (ref 5–15)
BUN: 42 mg/dL — AB (ref 6–23)
CHLORIDE: 102 meq/L (ref 96–112)
CO2: 25 meq/L (ref 19–32)
CREATININE: 1.01 mg/dL (ref 0.50–1.35)
Calcium: 8.3 mg/dL — ABNORMAL LOW (ref 8.4–10.5)
GFR calc Af Amer: 73 mL/min — ABNORMAL LOW (ref >90)
GFR calc non Af Amer: 63 mL/min — ABNORMAL LOW (ref >90)
GLUCOSE: 81 mg/dL (ref 70–99)
POTASSIUM: 4.7 meq/L (ref 3.7–5.3)
Sodium: 139 mEq/L (ref 137–147)

## 2014-08-03 LAB — CBC
HCT: 27.9 % — ABNORMAL LOW (ref 39.0–52.0)
Hemoglobin: 8.3 g/dL — ABNORMAL LOW (ref 13.0–17.0)
MCH: 26.8 pg (ref 26.0–34.0)
MCHC: 29.7 g/dL — ABNORMAL LOW (ref 30.0–36.0)
MCV: 90 fL (ref 78.0–100.0)
Platelets: 167 K/uL (ref 150–400)
RBC: 3.1 MIL/uL — ABNORMAL LOW (ref 4.22–5.81)
RDW: 23.9 % — ABNORMAL HIGH (ref 11.5–15.5)
WBC: 13.9 K/uL — ABNORMAL HIGH (ref 4.0–10.5)

## 2014-08-03 LAB — BASIC METABOLIC PANEL
Anion gap: 12 (ref 5–15)
BUN: 37 mg/dL — AB (ref 6–23)
CALCIUM: 8.5 mg/dL (ref 8.4–10.5)
CO2: 26 meq/L (ref 19–32)
Chloride: 97 mEq/L (ref 96–112)
Creatinine, Ser: 0.99 mg/dL (ref 0.50–1.35)
GFR calc Af Amer: 80 mL/min — ABNORMAL LOW (ref >90)
GFR, EST NON AFRICAN AMERICAN: 69 mL/min — AB (ref >90)
GLUCOSE: 101 mg/dL — AB (ref 70–99)
Potassium: 5.4 mEq/L — ABNORMAL HIGH (ref 3.7–5.3)
SODIUM: 135 meq/L — AB (ref 137–147)

## 2014-08-03 NOTE — Progress Notes (Signed)
   Name: Tanner Wu MRN: [id number] DOB: 1920-11-27    ADMISSION DATE:  07/04/2014 CONSULTATION DATE:  11/2  REFERRING MD :  Hijazi  CHIEF COMPLAINT:  Vent mgmt   BRIEF PATIENT DESCRIPTION:  80 yo male with chronic resp failure on home vent r/t C2-C3 quadriplegia, COPD, recurrent UTI initially admitted to outside hospital 10/29 with HCAP and acute on chronic mixed CHF.  He was treated with diuresis, abx and tx to [facility] 10/31.  PCCM consulted to assist with vent management.   SIGNIFICANT EVENTS    STUDIES:    SUBJECTIVE:  NSC   VITAL SIGNS: Vital signs reviewed. Abnormal values will appear under impression plan section.     PHYSICAL EXAMINATION: General:Air hungry on vent. General wasting away. Neuro:  Awake, spastic musculature  HEENT:  Trach size #8, no blood in trach Cardiovascular:  Irreg irreg Lungs:  Marked accessory muscle use   Abdomen:  BS+, PEG Musculoskeletal:  Minimal bulk, spastic LUE Skin:  Thin, multiple bruises throughout, multiple areas of skin breakdown.   Recent Labs Lab 07/30/14 0500 07/31/14 0522 08/03/14 0301  NA 135* 139 135*  K 5.6* 4.7 5.4*  CL 100 102 97  CO2 24 25 26   BUN 45* 42* 37*  CREATININE 1.04 1.01 0.99  GLUCOSE 82 81 101*    Recent Labs Lab 07/30/14 0500 07/31/14 0522 08/03/14 0301  HGB 9.2* 8.7* 8.3*  HCT 30.1* 29.3* 27.9*  WBC 13.7* 12.0* 13.9*  PLT 161 161 167   No results found.  ASSESSMENT / PLAN:  Acute on chronic respiratory failure  Chronic vent dependent  (Quad/COPD) > tolerated ATC  Up to 8 hours this admission, but worse with HCAP HCAP - stenotrophomonas, yeast per OSH  Hemoptysis 11/8 thru 11/12, resolving 11/16 Acute on chronic mixed CHF Hx PAF New Fever > per ID ESCOPD on amiodarone PAF Diarrhea   Discussion Has had several trach changes due to air leaks and has had several episodes of trach related trauma and bleeding. He is not Weanable. WBC rising. ? R/t diarrhea. Has sig Work of  breathing. Also lots of trouble w/ diarrhea.   Plan -  Cont vent support on home settings Culture urine, sputum and also send Cdiff PCR given new rise in WBC.  stopped deep suctioning, dc'dovenox for now and use SCD(discussed with Hijazi) PRN BD + pulmicort BID PT/OT  Ok for home from our standpoint    08/03/2014 8:57 AM   STAFF NOTE: I, Rory Percyaniel Feinstein, MD FACP have personally reviewed patient's available data, including medical history, events of note, physical examination and test results as part of my evaluation. I have discussed with resident/NP and other care providers such as pharmacist, RN and RRT. In addition, I personally evaluated patient and elicited key findings of: still malnourished, appears terminal with such poor quality, wean attempts futile, Absolutely NO TRACH COLLAR at home, high risk declines /coding from this, volumes require extra long, to home  Mcarthur Rossettianiel J. Tyson AliasFeinstein, MD, FACP Pgr: [phone] [facility] 08/03/2014 11:35 AM

## 2014-09-04 DEATH — deceased

## 2014-12-25 NOTE — Consult Note (Signed)
Brief Consult Note: Diagnosis: diarrhea.   Patient was seen by consultant.   Consult note dictated.   Discussed with Attending MD.   Comments: patient seen and examined. Persistent diarrhea x4-5 days, onset was several days after hospital admission. Has been on abx recently due to asa PNA, UTI. These have been discontinued. Cdiff negative, pt has had cdiff in the past. Stool culture reviewed and negative for ecoli, campylobacter, shigella, salmonella. moderate growth of candida albicans. Pt tolerating tube feeds well without n/v or other difficulty. Pt sits upright while getting feeds and stays at 45degree angle for one hour post-prandial, which we agree with. Pt's daughter denies any history of colonoscopy. Having BMs 4-6 times daily. No BRBPR/melena, stool is loose and extremely watery. Will discuss with Dr Mechele CollinElliott regarding management. continue Jevity, elevate HOB as mentioned above. will follow. full consult being dictated.  Electronic Signatures: Brantley StageEarle, Kaiyon Hynes M (PA-C)  (Signed 22-Apr-14 13:51)  Authored: Brief Consult Note   Last Updated: 22-Apr-14 13:51 by Ashok CordiaEarle, Lashon Beringer M (PA-C)

## 2014-12-25 NOTE — H&P (Signed)
PATIENT NAME:  Tanner Wu, Tanner Wu MR#:  [id number] DATE OF BIRTH:  [birth year]  DATE OF ADMISSION:  04/18/2013  ADMITTING PHYSICIAN: Enid Baasadhika Noma Quijas, MD  [facility]   CHIEF COMPLAINT: Brought in from daughter's  secondary to UTI, shaking, altered mental status and reactions to home antibiotics.    HISTORY OF PRESENT ILLNESS: Tanner Wu is a 81 year old, very fragile, elderly Caucasian male with past medical history significant for chronic respiratory failure secondary to COPD on 2 liters home oxygen, dementia, atrial fibrillation, sick sinus syndrome, status post pacemaker, congestive heart failure, EF of 20%, 8 cm abdominal aortic aneurysm which is nonoperable, who has dysphagia and multiple admissions for aspiration pneumonia, status post PEG tube placement and also has suprapubic catheter placed. Brought in from home by daughter, who is his healthcare power of attorney, secondary to above-mentioned complaints. Due to his dementia, the patient is unable to provide any history at all and most of the history is obtained from daughter, Ms. Tanner Wu, at bedside. According to her, the patient has been more lethargic and sleepy over the past week. They did urine testing as an outpatient. It was growing E. coli in the cultures, significant colony counts, sensitive to only a few antibiotics including imipenem, gentamicin, tobramycin and Macrodantin. Initially he was tried on Macrobid antibiotic, but the patient developed significant itching with that, so that was stopped, and home health nurse set up an IV and he was started on Primaxin, which has imipenem in it, and after a couple of days of dosing, daughter felt that the patient was having trouble breathing and was coughing and appeared to be dyspneic, so that was stopped and he was brought to the ER. In the ER, the patient is sleeping, easily arousable, though he got a dose of Xanax before he came to the hospital because he was trying  to be agitated at the EMS people, but according to daughter, he is usually alert, communicative and recognizes family. His white count is within normal limits. Urine reveals infection. He has been afebrile, slightly tachycardic, and he is being admitted for altered mental status at this time.   PAST MEDICAL HISTORY: 1.  Dementia.  2.  Posttraumatic stress disorder.  3.  Chronic respiratory failure secondary to COPD, on 2 liters home oxygen.  4.  Atrial fibrillation.  5.  Sick sinus syndrome, status post pacemaker placement.  6.  Congestive heart failure, EF of 20%.  7.  Abdominal aortic aneurysm, 8 cm, nonoperable.  8.  Dysphagia with multiple aspiration pneumonia, status post PEG tube placement.  9.  Chronic suprapubic catheter.   PAST SURGICAL HISTORY:  1.  PEG tube placement.  2.  Pacemaker placement.  3.  Suprapubic catheter placement.   ALLERGIES TO MEDICATIONS:  1.  ALBUTEROL.  2.  LEVAQUIN.  3.  NITROFURANTOIN.  4.  PRIMAXIN. 5.  SULFA DRUGS.  6.  KEFLEX.   CURRENT HOME MEDICATIONS:  1.  Tylenol 650 mg q.6 hours p.r.n. for pain or fever.  2.  Aspirin 81 mg via PEG tube daily.  3.  Digoxin 0.0625 mg tablet p.o. daily. 4.  Enalapril 5 mg p.o. daily. 5.  Fish oil 1 gram via PEG tube twice a day. 6.  Flunisolide 25 mcg nasal spray 1 puff both nostrils twice a day.  7.  Lasix 20 mg via PEG tube.  8.  Glycerin rectal suppository as needed every day for constipation.  9.  Guaifenesin cough syrup 10 mL 4 times  a day. 10.  Ipratropium 2.5 mL inhaled q.6 hours p.r.n. for wheezing.  11.  Ketoconazole topical 2% cream apply twice a day for eczema.  12.  Ketoconazole 2% topical shampoo at bedtime.  13.  Lansoprazole 30 mg p.o. daily.  14.  Loratadine 10 mg p.o. daily.  15.  Multivitamin 5 mL via PEG tube daily.  16.  Mupirocin topical ointment twice a day.  17.  Oxycodone 5 mg tablet 3 times a day as needed.  18.  Phenazopyridine 100 mg p.o. 3 times a day.  19.  Spiriva  inhalation capsule daily.  20.  Symbicort 160/4.5 mcg 2 puffs b.i.d.  21.  Zinc oxide topical 3 times a day as needed.   SOCIAL HISTORY: The patient lives at home with daughter as the primary caretaker. No history of alcohol or drugs. The patient quit smoking more than 10 years ago.   FAMILY HISTORY: The patient's mother with congestive heart failure and father died from prostate cancer. Sister had leukemia.   REVIEW OF SYSTEMS: Difficult to be obtained secondary to patient's altered mental status and also from his dementia.   PHYSICAL EXAMINATION: VITAL SIGNS: Temperature 97.3 degrees Fahrenheit, pulse 90, respirations 20, blood pressure 106/60, pulse oximetry 99% on room air.  GENERAL: A thin-built, ill-nourished male lying in bed, not in any acute distress.  HEENT: Normocephalic, atraumatic. Pupils  are symmetric, but not round. Has cataracts, nonsurgical. Sluggish  reaction to light. Extraocular movements intact. Oropharynx clear without erythema, mass or exudates.  NECK: Supple. No thyromegaly, JVD or carotid bruits. No lymphadenopathy.  LUNGS: Moving air bilaterally. Decreased bibasilar breath sounds. No wheeze or crackles. No use of accessory muscles for breathing.  CARDIOVASCULAR: S1, S2, regular rate and rhythm, II/VI systolic murmur. No rubs or gallops.  ABDOMEN: Soft, nontender, nondistended. No hepatosplenomegaly. PEG tube present. Normal bowel sounds.  EXTREMITIES: No pedal edema. No clubbing or cyanosis. Unable to palpate dorsalis pedis pulses, but  sensation is intact and movement is intact. Probably needs to be dopplered.  SKIN: Poor skin turgor. No rashes seen.  NEUROLOGIC: The patient did not cooperate for complete neuro exam. Cranial nerves seem to be intact, and he is able to move all 4 extremities and sensation is intact. His strength is symmetrically decreased in both lower extremities.  PSYCHOLOGIC: He is awake but not oriented.   LABORATORY DATA: WBC 10.5, hemoglobin  12.8, hematocrit 39.0, platelet count 169.  Sodium 139, potassium 5.4, chloride 101, bicarbonate 36, BUN 41, creatinine 0.84, glucose 77 and calcium of 9.9.   ALT 38, AST 67, alkaline phosphatase 178, total bilirubin 1.6, albumin of 3.3.   Troponin 0.07, CK-MB elevated at 4.8,; CK total is 115.  Digoxin is 0.9 and BNP is elevated at 3020.  Urinalysis with 3+ leukocyte esterase, 87 WBCs and trace bacteria.   Chest x-ray revealing right-sided pacemaker, cardiomegaly and bibasilar atelectasis. Aeration seems to be improved when  compared to the previous exam.   EKG: A. fib with rate control, no acute ST-T wave abnormalities.   ASSESSMENT AND PLAN: A 81 year old male with dementia, congestive heart failure with ejection fraction of 15%, atrial fibrillation, status post pacemaker, dysphagia, bedbound status, percutaneous endoscopic gastrostomy tube and suprapubic catheter, brought in for altered mental status and urinary tract infection and has developed reactions to outpatient antibiotics.  1.  Altered mental status, metabolic encephalopathy: Admit for neuro checks, urine and blood cultures, Empiric antibiotics. Chest x-ray with no acute changes. Will get ABG to rule out CO2  narcosis, also because of underlying chronic obstructive pulmonary disease history.  2.  Urinary tract infection: Has suprapubic catheter with previous urinary tract infections. Significant Escherichia coli colony count as an outpatient. Tried Primaxin and Macrobid at home with reactions, so discuss with pharmacy. Based on sensitivities, will start gentamicin and monitor carefully with trough levels. 3.  Dementia: Usually alert and oriented x 2 per daughter, now worse, likely from underlying infection. Monitor.  4.  Chronic respiratory failure secondary to chronic obstructive pulmonary disease: Continue inhalers. No active wheezing. Continue oxygen support.  5.  Atrial fibrillation, rate controlled: Hold digoxin, as level is  likely elevated as the patient also has underlying congestive heart failure.  6.  Abdominal aortic aneurysm, 8 cm, nonoperable. Continue to monitor.  7.  Hyperkalemia: One dose of Kayexalate being given and recheck in morning. 8.  Elevated troponins: Less likely to be non-ST segment elevation myocardial infarction, likely secondary to demand ischemia, but recycle cardiac enzymes, especially with his significant cardiac history.  9.  Congestive heart failure: Appears to be well-compensated, actually is clinically dry, though BNP is elevated. Gentle intravenous fluids and monitor.  10.  Gastrointestinal and deep venous thrombosis prophylaxis: With Protonix and TEDs and sequential compression devices.   CODE STATUS:  Discussed with daughter. He is limited code, with everything to be done except CPR.   TIME SPENT ON ADMISSION: 50 minutes.    ____________________________ Enid Baas, MD rk:jm D: 04/18/2013 20:26:31 ET T: 04/18/2013 21:52:32 ET JOB#: [id number]  cc: Enid Baas, MD, <Dictator> Enid Baas MD ELECTRONICALLY SIGNED 05/16/2013 16:01

## 2014-12-25 NOTE — Consult Note (Signed)
CC: diarrhea.  Pt and daughter are asleep this afternoon.  Nurse reports only 2 bowel movements and they are forming up.  Stop nystatin due to choking.  Continue diflucan.  Electronic Signatures: Scot JunElliott, Missael Ferrari T (MD)  (Signed on 24-Apr-14 17:21)  Authored  Last Updated: 24-Apr-14 17:21 by Scot JunElliott, Shericka Johnstone T (MD)

## 2014-12-25 NOTE — Discharge Summary (Signed)
PATIENT NAME:  Tanner Wu, Neri MR#:  [id number] DATE OF BIRTH:  [birth year]  DATE OF ADMISSION:  05/23/2013 DATE OF DISCHARGE:  06/02/2013  The [facility]. For H and P and previous interim discharge summaries, please see the dictation on the day of admission as well as the summary on September 29 by Dr. Allena KatzPatel.  FINAL DIAGNOSES: 1.  Acute on chronic hypoxic and hypercapnic respiratory failure, currently intubated.  2.  Acute encephalopathy secondary to narcosis.  3.  Acute renal failure, resolved.  4.  Hypernatremia, improving.  5.  History of multiple urinary tract infections with colonization with resistant bacteria with chronic suprapubic catheter.  6.  History of recent cervical vertebral fracture status post collar.  7.  Constipation  8.  Shock, currently resolved, unknown source. 9.  History of chronic atrial fibrillation.  CURRENT MEDICATIONS:  1.  Amiodarone 100 mg via PEG tube daily.  2.  Bacitracin apply to affected area daily.  3.  Digoxin 0.0625 mg via PEG daily.  4.  Fluticasone nasal spray 2 sprays to both nostrils daily.  5.  Claritin 10 mg daily.  6.  Solu-Medrol 60 mg IV daily. 7.  Multivitamin liquid 5 mL orally daily. 8.  Pantoprazole 40 mg via nasogastric q. 24 hours.  9.  Aspirin 81 mg daily. 10.  Heparin 5000 units q. 8 hours.  11.  Insulin drip, which has just been held.  12.  Xopenex SVN 1.25 mg q. 8 hours.  13.  Tube feeds.  14.  Propofol drip.  15.  Morphine, oxycodone and Zofran p.r.n.  16.  Hydralazine p.r.n.   HOSPITAL COURSE:  Since the 27th of September, which this summary will start with, he has done fairly well. He is on the ventilator. He has been weaned off of pressors. He has had hypernatremia and his IV fluids were adjusted to have hypertonic saline and also his free water via tube feed was adjusted. His renal failure has resolved. In regards to his previous shock, his blood  cultures have been no growth to date. He has low grade fevers, but no significant fevers, and would pan culture if he does spike a fever. He is not on any antibiotics currently. His WBC is lower. There have been several conversations with a daughter, who is the healthcare power of attorney, and she wants FULL CODE and aggressive measures including trach if needed. The case has been followed by care management and at this point [facility]. The patient is FULL CODE.   TOTAL TIME SPENT: 40 minutes.   ____________________________ Krystal EatonShayiq Lela Murfin, MD sa:sb D: 06/02/2013 13:38:03 ET T: 06/02/2013 14:00:40 ET JOB#: [id number]  cc: Krystal EatonShayiq Channing Yeager, MD, <Dictator> Krystal EatonSHAYIQ Dameka Younker MD ELECTRONICALLY SIGNED 06/19/2013 14:10

## 2014-12-25 NOTE — Discharge Summary (Signed)
PATIENT NAME:  Tanner Wu, SPACKMAN MR#:  [id number] DATE OF BIRTH:  07-29-1921  DATE OF ADMISSION:  10/21/2012 DATE OF DISCHARGE:  10/25/2012  CONSULTANT: Dr. Harvie Junior from Palliative Care.   CHIEF COMPLAINT: Fever, [facility].   DISCHARGE DIAGNOSES:  1. Sepsis likely secondary to aspiration pneumonia.  2. Dehydration.  3. Hypernatremia.  4. Dementia.  5. A history of dysphagia status post percutaneous endoscopic gastrotomy tube. 6. Acute on chronic respiratory failure on 2 liters of nasal cannula oxygen.  7. Elevated troponin, likely demand ischemia.  8. Chronic obstructive pulmonary disease.  9. A history of atrial fibrillation, rate controlled, status post permanent pacemaker.  10. A history of congestive heart failure, unknown type.  11. AAA 8 cm. 12. A history of a suprapubic catheter.   DISCHARGE MEDICATIONS:  Levaquin 500 mg via PEG daily for 5 days, ipratropium 2.5 mL every 6 hours as needed for shortness of breath, Spiriva 18 mcg inhaled daily, albuterol 1.25 mg/3 mL, 3 mL, 3 times a day as needed for shortness of breath, docusate sodium  10 mL 2 teaspoons full via PEG 2 times a day, multivitamin 5 mL daily, Children's Allergy 12.5 mg/5 mL take 2 teaspoons via PEG 3 times a day with antibiotics, amiodarone 100 mg via PEG daily, alprazolam 1 mg via PEG 2 times a day as needed, Lasix 20 mg 1 tab via PEG daily, oxycodone 5 mg via PEG 3 times a day as needed for pain, fish oil 1000 mg 1 tab 2 times a day via PEG, digoxin 125 mcg take 1/2 tablet via PEG daily, aspirin 81 mg via PEG daily, loratadine 10 mg via PEG tube daily, acetaminophen 225 mg 2 tabs via PEG every 6 hours as needed for pain or fever, glycerin one suppository 2 times a day as needed for constipation, Mupirocin topical 2% ointment applied to scab once a day as needed, Ketoconazole topical 2% apply a small amount to affected area 2 times a day for eczema. The patient will be going home with PT and R.N., 2 liters nasal cannula  and Jevity as previous with free water flushes as prior.   ACTIVITY: As tolerated.   FOLLOWUP: Please follow with PCP at the [place] within 1 to 2 days.   CODE STATUS: DO NOT RESUSCITATE.  HISTORY OF PRESENT ILLNESS AND HOSPITAL COURSE: For full details of history and physical, please see the dictation on 10/21/2012 by Dr. Jacques Navy. Briefly, this is an 81 year old male with dementia, status post PEG, who was hospitalized at [facility] for a pneumonia recently who came in with sepsis, hypernatremia and fevers and admitted to the Hospitalist service. He was noted to have initial sodium of 153 with initial troponin of 0.27 and WBC of 24.9. He was started on broad-spectrum antibiotics including vancomycin, Zosyn and Levaquin. He was cultured including blood and urine. The blood cultures have been no growth to date. A UA did suggest a possible infection; however, urine cultures are growing yeast and the patient has chronic indwelling catheter. The leukocytosis has resolved with the broad-spectrum antibiotics. It slowly went down to normal on day 4 of hospitalization. The hyponatremia has been significantly resolved with the last sodium is 146. The patient has been on D5W and increased free water flushes. The sepsis has resolved. The patient has been on IV fluids for the dehydration. The elevated troponin is likely demand ischemia and no further cardiac workup was initiated. His heart rate was controlled. His digoxin and amiodarone was resumed. He does  not appear to be in overt CHF. At this point with the sepsis resolved he will be [facility]  CODE STATUS: HE IS DO NOT RESUSCITATE.   TOTAL TIME SPENT: 35 minutes.   ____________________________ Krystal EatonShayiq Vance Hochmuth, MD sa:jm D: 10/25/2012 11:30:54 ET T: 10/25/2012 14:42:53 ET JOB#: [id number]  cc: Krystal EatonShayiq Razi Hickle, MD, <Dictator> Krystal EatonSHAYIQ Malayshia All MD ELECTRONICALLY SIGNED 11/11/2012 13:12

## 2014-12-25 NOTE — Consult Note (Signed)
Brief Consult Note: Diagnosis: SOB.   Patient was seen by consultant.   Orders entered.   Comments: Patient with h/o afib and pacemaker who was admitted with SOB, aspiration PNA, but also had elevated BNP 3,560. Will get echo to check systolic and diastolic fx to r/o underlying CHF as contributor. Pt already on broad spectrum antibiotics.  Electronic Signatures: Radene KneeKhan, Shaukat Ali (MD)   (Signed 17-Apr-14 08:07)  Co-Signer: Brief Consult Note Nahun Kronberg A (PA-C)   (Signed 16-Apr-14 08:48)  Authored: Brief Consult Note  Last Updated: 17-Apr-14 08:07 by Radene KneeKhan, Shaukat Ali (MD)

## 2014-12-25 NOTE — H&P (Signed)
PATIENT NAME:  Tanner Wu, Markeith MR#:  [id number] DATE OF BIRTH:  [birth year]  DATE OF ADMISSION:  10/21/2012  REFERRING PHYSICIAN:   Su Leyobert L. Kinner, MD   [facility]  CHIEF COMPLAINT: Brought in by daughter for fevers.   HISTORY OF PRESENT ILLNESS: The patient is an 81 year old Caucasian male with a long-standing history of dementia for at least 10 years or so with multiple aspiration pneumonias, per daughter, status post PEG placement in April 2013, who [facility] Per daughter, the  patient experienced a viral gastroenteritis and had bouts of nausea, vomiting, and then had an aspiration event, and developed shortness of breath, and [facility] about a week. Per daughter, he was started on vancomycin and another antibiotic. Of note, he was started on Keflex prehospitalization by the [place] [facility] [place] [facility] [place]. Posthospitalization, his Keflex was resumed and he is on day 4, but last night developed fevers and the patient has had poor p.o. intake. Fevers did not come down with Tylenol, and the patient was also given some fluids through the PEG. The patient is n.p.o. As the fevers persisted, the patient came to the hospital and was noted to have evidence for pneumonia with leukocytosis, positive troponin, dehydration, sodium of 153, and hospitalist service was contacted for further evaluation and management. The history was obtained from ER records and the daughter as the patient is unable to provide any history.   PAST MEDICAL HISTORY: 1. History of dementia, long-standing about a decade or so.  2. Chronic respiratory failure on 2 liters nasal cannula.  3. Chronic obstructive pulmonary disease.  4. Atrial fibrillation.  5. Congestive heart failure, unknown ejection fraction.  6. AAA at 8 cm, not a surgical candidate.  7. Status post permanent pacemaker.  8. Status post PEG, status post multiple aspiration pneumonias and dysphagia.   9. History of suprapubic catheter.   FAMILY HISTORY: Mom with CHF, dad with prostate cancer, and a sister with leukemia,   SOCIAL HISTORY: A long-time smoker, smoked about 73 years.  He quit several years ago. No alcohol or drug use. He lives with his daughter who recently moved to this area.   REVIEW OF SYSTEMS: Unable to fully obtain given the patient's dementia status.   ALLERGIES: ALBUTEROL, WHICH CAN TRIGGER V. TACH, AND SULFA DRUGS.  OUTPATIENT MEDICATIONS: Acetaminophen 650 mg every 6 hours p.r.n. via PEG, Alprazolam 1 mg via PEG 2 times a day as needed for anxiety, amiodarone 200 mg 1/2 tab once a day via PEG, aspirin 81 mg 1 tab via PEG daily, cephalexin 500 mg 3 times a day for 7 days via PEG,  allergy 12.5 mg per 5 mL oral liquid 10 mL via PEG 3 times a day, Digoxin 125 mcg 1/2 tab via PEG daily, docusate sodium 60 mg/15 mL oral syrup 10 mL via PEG 2 times a day, fish oil 1000 mg via PEG 2 times a day, flunisolide nasal spray 25 mcg 1 puff 2 times a day as needed, Lasix 20 mg daily via PEG, glycerin suppository p.r.n., ipratropium p.r.n., ketoconazole to topical solution 2% topical cream applied a small amount to affected areas 2 times a day for eczema, Xopenex 3 mL 1.25 mg/3 mL inhaled solution 3 times a day as needed for shortness of breath, loratadine 10 mg daily, multivitamin 5 mL daily, oxycodone 5 mg 3 times a day as needed for pain, phenazopyridine 100 mg 1 tab via PEG  3 times a day as needed for urinary pain, prednisone 10 mg once a day for 14 days, Spiriva 18 mcg daily, and a cough syrup.   PHYSICAL EXAMINATION: VITAL SIGNS: Temperature on arrival 100.4, pulse 94, respiratory rate 24, blood pressure 113/61, oxygen saturation 98% on oxygen.  GENERAL: The patient is a chronically ill-appearing male lying in bed in no obvious distress.  HEENT: Temporal wasting. Normocephalic, atraumatic. Pupils are equal and reactive. Very dry mucous membranes and poor dentition.  NECK: Supple. No  thyroid tenderness. No cervical lymphadenopathy.  CARDIOVASCULAR: S1, S2 with regular rate and rhythm. No significant murmurs. LUNGS: Very poor effort, decreased breath sounds at the bases with some crackles.  ABDOMEN: Status post PEG tube, no rounding, cellulitis or significant drainage. No tenderness to palpation, positive bowel sounds in all quadrants.  EXTREMITIES: No significant lower extremity edema. There is also a suprapubic catheter without significant evidence for cellulitis or redness.  NEUROLOGICAL: Unable to do a full neurological exam, but the patient moves all extremities spontaneously.  PSYCHIATRIC: Awake, alert, not communicating.    LABORATORY AND RADIOLOGICAL DATA:  Glucose 98, BUN 61, creatinine 1.19 sodium 153, potassium 3.7, chloride 111, serum CO2 37, bilirubin 1.3, alkaline phosphatase 141, AST 48. Troponin 0.27. WBC 24.9, hemoglobin 44.7, platelets are 130. UA: 3+ leukocyte esterase, 14 WBC, 59 RBC.   EKG: Sinus rhythm with first-degree AV block, rate is 95, a bifascicular block. No acute ST elevations or significant depressions noted. X-ray of the chest, PA and lateral, showing cardiomegaly with thoracic aortic ectasia and left hilar prominence with some lung base pneumonia.   ASSESSMENT AND PLAN: We have an 81 year old male with likely advanced dementia, COPD, chronic respiratory failure, and a history of multiple aspiration pneumonias, status post PEG tube, with a recent pneumonia, probably aspiration per description, and hospitalization at [facility] for a week with persistent fevers last night. At this point, we will admit the patient to the hospital. The patient does have evidence for sepsis, including fever and leukocytosis, although he is on prednisone which could elevate the WBC count. Probable sources are the lungs at this point. I would start the patient on vancomycin, Zosyn and Levaquin for healthcare-associated pneumonia coverage and possible recurrent aspiration coverage  as well, obtain cultures and supply oxygenation at this point and see how he does. The patient also appears to be dehydrated per exam as well as evidence for hypernatremia, and he is also on Lasix which we would hold at this point and start the patient on one-half normal saline, and obtain a dietary consult for resumption of PEG tube, and continue his medications via PEG at this point. He does have elevated troponin, but that is likely demand ischemia from dehydration and pneumonia and sepsis. I would cycle the troponins but will not partake any aggressive measures as he is DNR, the daughter does not want any aggressive measures at this point.    He does have an 8 cm aortic aneurysm, per daughter, and is not a surgical candidate; and this is to be just watched, and the daughter stated that she does not want any measures for this.   In regards to his COPD and chronic respiratory failure, he is on oxygen. We would continue his inhalers. He cannot take Albuterol for possible triggering of V tach.   In regards to his CHF, he appears to be dehydrated, would hold Lasix. Continue some fluids and watch his respiratory status.   CODE STATUS: The patient is a DNR.  TOTAL TIME SPENT: 55 minutes.    ____________________________ Krystal Eaton, MD sa:cb D: 10/21/2012 15:34:41 ET T: 10/21/2012 17:07:36 ET JOB#: [id number]  cc: Krystal Eaton, MD, <Dictator> Krystal Eaton MD ELECTRONICALLY SIGNED 11/11/2012 13:11

## 2014-12-25 NOTE — H&P (Signed)
PATIENT NAME:  Tanner Wu, GOOGE MR#:  [id number] DATE OF BIRTH:  02/05/1921  DATE OF ADMISSION:  02/19/2013  PRIMARY CARE PHYSICIAN: At [place].  REFERRING EMERGENCY ROOM PHYSICIAN: Dr. Dorothea Glassman.   CHIEF COMPLAINT: Hematuria.  HISTORY OF PRESENT ILLNESS:  The patient is having dementia, so history obtained from his daughter, Ms. [name].  Her number is [phone].  As per the daughter, the patient has long-standing dementia and he has chronic respiratory failure, on oxygen supplementation. He had recurrent aspiration pneumonia and that is why he ended up having PEG tube to prevent aspiration, and he had urinary problems with distended bladder and so he is having suprapubic catheter since long time. Daughter is his caretaker, taking care of him at home. He has visiting doctor from [place] who comes at home. Yesterday, she came at home and changed the suprapubic catheter and at that time they had some bleeding, so the doctor told the patient's daughter to do bladder irrigation with normal saline.  The patient's daughter tried it almost 10 times in the night, but the blood was so thick it was getting clotted in the catheter and in the drainage tube and then in the collection bag, and so she could not continue anymore and she brought him to the Emergency Room today morning. In the ER, it was noted having frank red blood in the catheter and in the urine bag and so, ER physician spoke to urologist who suggested to start him on continued bladder irrigation and change the catheter to 24 French catheter. That was done by emergency physician and we are admitting him for further management.   REVIEW OF SYSTEMS:  Unable to get as the patient is demented.   PAST MEDICAL HISTORY:   1. Long-standing dementia for more than 10 years.  2. Chronic respiratory failure, on 2 liters oxygen supplementation.  3. Chronic obstructive pulmonary disease.  4. History of adrenal fibrillation, status post pacemaker placement.   5. History of congestive heart failure ejection fraction 20% as per recent echocardiogram.  6. Abdominal aortic aneurysm 8 cm; not a surgical candidate due to moderately high risk.  7. Status post PEG tube placement, status post suprapubic catheter placement.  8. Multiple aspiration pneumonias and chronic dysphagia per daughter.  ALLERGIES:  ALBUTEROL, LEVOFLOXACIN AND SULFA DRUGS.   HOME MEDICATIONS:  1. Zinc oxide topical cream apply affected area.  2. Symbicort 2 puffs 2 times a day inhalations. 3. Spiriva 18 mcg 1 inhalation once a day.  4. Oxycodone 5 mg oral tablet 3 times a day.  5. Multivitamin liquid via PEG tube once a day.  6. Lansoprazole 15 mg oral tablet 2 times a day.  7. Ketoconazole topical cream apply affected area 2 times a day.  8. Glycerin suppository rectally once a day as needed for constipation.  9. Furosemide 20 mg oral tablet PEG tube once a day.  10. Fish oil 1000 mg oral tablet via PEG tube 2 times a day.  11. Enalapril 5 mg oral tablet once a day.  12. Digoxin 125 mcg once a day.  13. Aspirin 81 mg oral tablet via PEG tube once a day. 14. Acetaminophen 325 mg oral tablet 2 tablets via PEG tube every 6 hours as needed.   SOCIAL HISTORY: A long time smoker, smoked until age 81. No alcohol or drug use. Lives at home now with daughter as caretaker.   REVIEW OF SYSTEMS:  Unable to obtain due to patient's dementia.   FAMILY HISTORY: Mother  had congestive heart failure. Father died of prostate cancer. Sister with leukemia.   PHYSICAL EXAMINATION: VITAL SIGNS: Temperature 98.6, pulse 60, respirations 26, blood pressure 102/59.  GENERAL:  The patient is alert but not oriented. He is having very short breathing using oxygen via nasal cannula but cooperative with physical examination. Daughter says this breathing is baseline to him.   HEENT: Head and neck atraumatic. Conjunctivae pink. Oral mucosa moist.   NECK: Supple. No JVD.   RESPIRATORY: Bilateral clear  and equal air entry.   CARDIOVASCULAR: S1, S2 present, pacemaker present, regular.   ABDOMEN: PEG tube present. Soft, nontender. Bowel sounds present. Suprapubic catheter present with draining blood.   SKIN: No rashes.   EXTREMITIES: Legs, no edema.   NEUROLOGICAL: No tremors or rigidity. Moves all limbs, but generalized weakness: Power 3/5.  LABORATORY DATA:  BUN 37, creatinine 0.92, sodium 143, potassium 5.2, chloride 103, CO2 of 35, calcium 9.9, total protein 7, albumin 3.5, bilirubin 0.5, alkaline phosphate 195 and SGOT 64, SGPT 54. WBC count 11.9, hemoglobin 12.5, platelet count 178, mean corpuscular volume 87. Urinalysis is cloudy and grossly bloody with 30,000 RBCs present.   ASSESSMENT AND PLAN: A 81 year old male with multiple past medical problems having suprapubic catheter came with hematuria and blocked catheter due to that.  The catheter is changed in the ER by ER physician and urologist saw him.  Starting him on continuous bladder irrigation.  1. Hematuria. Continuous bladder irrigation and urology consult. Stop aspirin as he was taking and will not give any anticoagulant. Monitor hemoglobin every 8 hourly as there was questionable history of urine infection, as per daughter last week it was checked. Urine culture had 75,000 colony-forming units. No antibiotics were given.  Will also do urine culture and follow.  2. Chronic obstructive pulmonary disease.  He sounds to be at his baseline. No wheezing, using oxygen will continue with Spiriva bronchodilators as needed.  3. Atrial fibrillation. Will stop aspirin due to bleed and we might not restart it in the future and we will continue digoxin.  4. History of congestive heart failure. Ejection fraction 20% chronic systolic failure. Currently, he is stable. There are no signs of heart failure. He was taking Lasix and enalapril at home, but due to borderline blood pressure and hematuria, I would like to hold his medication at this time and  may be restarted later on.  5. Status post PEG tube.  We will continue feeding as his home schedule suggested by daughter.   CODE STATUS is limited code. Discussed with daughter in detail. She is healthcare power of attorney.  No cardiopulmonary resuscitation, but everything else is yes.   TOTAL TIME SPENT ON THIS ADMISSION: 50 minutes.    ____________________________ Hope PigeonVaibhavkumar G. Elisabeth PigeonVachhani, MD vgv:rw D: 02/19/2013 17:16:22 ET T: 02/19/2013 19:18:08 ET JOB#: [id number]  cc: Hope PigeonVaibhavkumar G. Elisabeth PigeonVachhani, MD, <Dictator> Altamese DillingVAIBHAVKUMAR Brenda Samano MD ELECTRONICALLY SIGNED 03/11/2013 14:22

## 2014-12-25 NOTE — Discharge Summary (Signed)
PATIENT NAME:  Tanner Wu, Tanner Wu DATE OF BIRTH:  [birth year]  DATE OF ADMISSION:  02/19/2013 DATE OF DISCHARGE:  02/21/2013  [facility]  ACCEPTING PHYSICIAN[facility]  [facility]  DISCHARGE DIAGNOSES:  1.  Hematuria, on continuous bladder irrigation.  2.  Chronic obstructive pulmonary disease, stable.  3.  History of chronic atrial fibrillation status post pacemaker, on digoxin.  4.  Chronic systolic heart failure with ejection fraction of 20%.   SECONDARY DIAGNOSES:  1.  Long-standing dementia for 10 years.  2.  Chronic respiratory failure, on 2 liters oxygen. 3.  Chronic obstructive pulmonary disease.  4.  History of pacemaker placement. 5.  Congestive heart failure with ejection fraction of less than 20%.  6.  Abdominal aortic aneurysm, about 8 cm, not a surgical candidate due to moderately high risk. 7.  Status post PEG tube placement and suprapubic catheter placement. 8.  Multiple aspiration pneumonias and chronic dysphagia.   CONSULTANTS: Urology, Dr. Anola GurneyMichael Wu.   PROCEDURES AND RADIOLOGY: Continuous bladder irrigation, started by Tanner Wu.   MAJOR LABORATORY PANEL:  UA on admission was negative. Urine culture was negative.   HISTORY AND SHORT HOSPITAL COURSE: The patient is a 81 year old male with above-mentioned medical problems who was admitted for hematuria. Urology consultation was obtained with Dr. Anola GurneyMichael Wu.  Please Dr. Elisabeth Wu dictated history and physical for further details. Tanner Wu recommended continuous bladder irrigation with normal saline, which was started and has continued to date. He is still having minimal blood in there.  His catheter was clotted off yesterday and required a chest drain by Tanner Wu again and is working now. Tanner Wu has recommended continuous irrigation until bleeding is cleared, until Monday, and then if it does not clear he may need cysto fulguration per his recommendation.  His urine has been slowly clearing up, although per family whenever it stops it starts bleeding again.   CURRENT MEDICATIONS: While in the hospital: 1.  Flonase 2 sprays to both nostrils daily.  2.  Glycerin pediatric suppository 1 per rectal for constipation.  3.  Multivitamin once daily.  4.  Roxicodone 5 mg via PEG tube every 8 hours as needed.  5.  Spiriva once daily. 6.  Alprazolam 0.5 mg via PEG tube every 8 hours as needed.  7.  Amiodarone 100 mg p.o. at bedtime.  8.  Enalapril 5 mg p.o. daily.  9.  Lidocaine 5% ointment apply to affected area every 8 hours for penile discomfort. 10.  Digoxin 125 mcg p.o. daily.   TOTAL TIME: Taking care of this patient was 45 minutes. ____________________________ Tanner SiaVipul S. Sherryll BurgerShah, MD vss:sb D: 02/21/2013 16:05:09 ET T: 02/21/2013 16:34:07 ET JOB#: [id number]  cc: Estell Dillinger S. Sherryll BurgerShah, MD, <Dictator> Dr. Vedia Wu [facility] Tanner ConnersMichael R. Tanner CroonWolff, MD  Tanner SiaVIPUL S [facility] Hospital[facility] MD ELECTRONICALLY SIGNED 02/23/2013 15:09

## 2014-12-25 NOTE — Consult Note (Signed)
CC: diarrhea.  Discussed nystatin and Diflucan therapy with daughter who is his care giver and he lives with her.  Hopefully will help his diarrhea.  Will follow.  Electronic Signatures: Scot JunElliott, Gerda Yin T (MD)  (Signed on 23-Apr-14 17:33)  Authored  Last Updated: 23-Apr-14 17:33 by Scot JunElliott, Claiborne Stroble T (MD)

## 2014-12-25 NOTE — Consult Note (Signed)
Brief Consult Note: Diagnosis: Hematuria.   Patient was seen by consultant.   Consult note dictated.   Recommend to proceed with surgery or procedure.   Recommend further assessment or treatment.   Orders entered.   Discussed with Attending MD.   Comments: 9424 F 3-way catheter with continuous CBI with NS. [facility].  Electronic Signatures: Orson ApeWolff, Michael R (MD)  (Signed 18-Jun-14 17:30)  Authored: Brief Consult Note   Last Updated: 18-Jun-14 17:30 by Orson ApeWolff, Michael R (MD)

## 2014-12-25 NOTE — Consult Note (Signed)
CC: diarrhea.  His diarrhea has stopped since he got the Diflucan.  I will sign off.   Electronic Signatures: Scot JunElliott, Taro Hidrogo T (MD)  (Signed on 25-Apr-14 17:03)  Authored  Last Updated: 25-Apr-14 17:03 by Scot JunElliott, Cyrus Ramsburg T (MD)

## 2014-12-25 NOTE — Discharge Summary (Signed)
PATIENT NAME:  Tanner Wu, Tanner Wu MR#:  [id number] DATE OF BIRTH:  1921-05-02  DATE OF ADMISSION:  12/14/2012 DATE OF DISCHARGE:  12/29/2012  PRIMARY CARE PHYSICIAN: None local.  CONSULTING PHYSICIAN: Dr. Mechele CollinElliott and Dr. Welton FlakesKhan, Dr. Harvie JuniorPhifer.   DISCHARGE DIAGNOSES: 1.  Aspiration pneumonia.  2.  Acute-on-chronic systolic heart failure, with ejection fraction 20% to 25%.  3.  Chronic respiratory failure, on home oxygen.  4.  Diarrhea. 5.  Chronic obstructive pulmonary disease.  6.  Urinary tract infection.  7.  History of chronic atrial fibrillation, status post pacemaker.  8.  Gastroesophageal reflux disease.   CONDITION: Stable.   CODE STATUS: limited code.  HOME MEDICATIONS: Please refer to [facility] discharge instruction medication reconciliation list.   The patient needs home health and the physical therapy. Also needs home oxygen, 2 liters by nasal cannula.   DIET: Jevity 1.5 bolus feeds with PEG tube.   ACTIVITY: As tolerated.   FOLLOWUP CARE: Follow up with PCP within 1 to 2 weeks.   Follow up with PCP within 1 week. Follow up with Dr. Welton FlakesKhan of cardiology within 1 week.   Also, the patient needs aspiration precautions.   REASON FOR ADMISSION: Shortness of breath, cough, and fever.   HOSPITAL COURSE: The patient is a 81 year old Caucasian male with a longstanding history of dementia, multiple admissions for aspiration pneumonia status post PEG tube, presented to the ED with shortness of breath, cough, fever. The patient was noted to have a UTI and pneumonia and admitted for further treatment.   For detailed history and physical examination, please refer to the admission note dictated by  Dr. Auburn BilberryShreyang Patel.   On the admission date the patient's WBC was 15.4, hemoglobin 10.4. Urinalysis showed  3+ leukocytosis, WBC 254.   Chest x-ray showed possible left lower lobe infiltrate.   On admission the patient was admitted for acute respiratory failure with aspiration  pneumonia, UTI. After admission, the patient was treated with Solu-Medrol, Zosyn, and Xopenex for respiratory failure and aspiration pneumonia. The patient's symptoms have much-improved after above-mentioned treatments. The patient's white count have decreased to a normal range. However, the patient developed diarrhea eight days after Zosyn treatment, so Zosyn was discontinued. We checked a CT, which was negative, so the patient has been treated with Lomotil p.r.n. for diarrhea. His diarrhea has improved for the past three days.  Acute-on-chronic CHF, systolic dysfunction, with ejection 20% to 25%. Initially the patient was admitted for aspiration pneumonia, but the patient developed worsening shortness of breath during hospitalization. The patient's chest x-ray showed pulmonary edema, so the patient has been treated with Lasix. Symptoms have much improved, but the patient still has chronic respiratory failure with need of home oxygen, 2 liters.   Since the patient is stable the patient will be discharged to home with home health physical therapy and home oxygen. I have discussed the patient's discharge plan with the patient's daughter for a long time, answered all questions. Patient will be discharged to home today.   Time spent: About 48 minutes.     ____________________________ Shaune PollackQing Rolondo Pierre, MD qc:dm D: 12/29/2012 11:36:03 ET T: 12/29/2012 12:05:57 ET JOB#: [id number]  cc: Shaune PollackQing Talon Regala, MD, <Dictator> Shaune PollackQING Lillah Standre MD ELECTRONICALLY SIGNED 12/30/2012 15:33

## 2014-12-25 NOTE — Consult Note (Signed)
PATIENT NAME:  Tanner Wu, Tanner Wu MR#:  [id number] DATE OF BIRTH:  [birth year]  DATE OF CONSULTATION:  02/19/2013  REFERRING PHYSICIAN:  Dr. Dorothea GlassmanPaul Malinda CONSULTING PHYSICIAN:  Suszanne ConnersMichael R. Evelene CroonWolff, MD  REASON FOR CONSULTATION: Hematuria.   HISTORY OF PRESENT ILLNESS: Mr. Tanner Wu is a 81 year old white male with dementia, who has a feeding tube as well as has a suprapubic tube. The suprapubic tube was changed yesterday, which resulted in gross hematuria. His caretaker could not irrigate it until clear and this prompted an Emergency Room visit. ER personnel were also unable to irrigate it until clear. He has a history of recurrent urinary tract infections and had a urine culture done earlier this week which grew out 75,000 colonies of unknown identification. He is not able to provide a medical history. He had a suprapubic originally placed by [place]rolog[place] in [place] and does receive some of [facility].   PAST MEDICAL HISTORY:  ALLERGIES: No drug allergies.   CHRONIC MEDICATIONS:  Include alprazolam, Tylenol, amiodarone, aspirin, digoxin, Colace.    PAST AND CURRENT MEDICAL CONDITIONS:  1.  Dementia of greater than 10 years' duration.  2.  Chronic respiratory failure.  3.  Chronic obstructive pulmonary disease.  4.  Atrial fibrillation with pacemaker present.  5.  Congestive heart failure.  6.  Aortic aneurysm.  7.  Chronic aspiration status requiring PEG tube.   PHYSICAL EXAMINATION:  ABDOMEN:  Soft. Suprapubic tube appeared to be present and urine was blood-tinged.   IMPRESSION:  1.  Gross hematuria.  2.  Chronic suprapubic tube.     SUGGESTIONS: 1.  Placed 24-French three-way Foley catheter and start continuous bladder irrigation with normal saline until clear.  2.  Treat urinary tract infection if indicated.  3.  Follow-up with his primary urologist or [facility] for follow-up cystoscopy.  Patients with chronic indwelling suprapubic tubes are high risk  for squamous cell carcinoma of the bladder.   ____________________________ Suszanne ConnersMichael R. Evelene CroonWolff, MD mrw:cc D: 02/19/2013 17:35:58 ET T: 02/19/2013 18:24:15 ET JOB#: [id number]  cc: Suszanne ConnersMichael R. Evelene CroonWolff, MD, <Dictator> Orson ApeMICHAEL R Joh Rao MD ELECTRONICALLY SIGNED 02/20/2013 8:04

## 2014-12-25 NOTE — Consult Note (Signed)
PATIENT NAME:  Tanner Wu, Tanner Wu MR#:  [id number] DATE OF BIRTH:  04-02-1921  DATE OF CONSULTATION:  12/24/2012  REFERRING PHYSICIAN:  Hope Pigeon. Elisabeth Pigeon, MD CONSULTING PHYSICIAN:  Lynnae Prude, MD/Amberia Bayless M. Orel Cooler, PA-C  REASON FOR CONSULTATION: Diarrhea.   HISTORY OF PRESENT ILLNESS: This is a pleasant 81 year old gentleman with a past medical history significant for dysphagia and aspiration pneumonia who is status post PEG tube placement in April 2013. He was admitted with what appeared to be recurrent aspiration pneumonia, as he did have some shortness of breath and was requiring additional oxygen. He was on antibiotics, including Zosyn, for approximately 8 days. This was discontinued because he began developing diarrhea since his admission. A C. diff was checked and returned negative. Stool culture returned negative for E. coli, Salmonella and campylobacter, however, was positive for Candida albicans.  Diarrhea has been anywhere from 4 to 6 times per day and is loose and very watery. He denies any evidence of bright red blood or melena. The patient denies abdominal pain; however, there is some mild cramping and discomfort. No nausea or vomiting. The patient has been tolerating his tube feeds well and has been elevating the head of the bed with these feeds as necessary to take aspiration precautions. The patient has also been started on lactobacilli as well as Imodium for management of his diarrhea. No unintentional weight changes. The patient does have significant dementia, and his daughter was present for this discussion and was able to provide much of the history.   ALLERGIES: ALBUTEROL, LEVAQUIN AND SULFA.  PAST MEDICAL HISTORY: COPD with home oxygen, atrial fibrillation, CHF with an ejection fraction of 20 to 25%, dementia, AAA and history of aspiration requiring tube feeds.   PAST SURGICAL HISTORY: PEG tube placement in April 2013 and pacemaker placement.   SOCIAL HISTORY: Remote tobacco  use, quitting at age 87. No current alcohol, tobacco or illicit drug use.   FAMILY HISTORY: No known family history of GI malignancy or IBD.   REVIEW OF SYSTEMS: Significantly limited as the patient has significant dementia and is difficult to obtain a history from.  PHYSICAL EXAMINATION:  VITAL SIGNS: Blood pressure 118/70, heart rate 65, respirations 18, temperature 98 degrees, bedside pulse ox is 95% on nasal cannula.  GENERAL: This is a pleasant 81 year old gentleman resting quietly and comfortably in a chair at bedside, alert but difficult to tell if oriented, in no acute distress.  HEENT: Head atraumatic, normocephalic. Sclerae anicteric.  Mucous membranes moist. NECK: Supple. No lymphadenopathy noted.  PULMONARY: Respirations are even and unlabored with mild diffuse crackles and wheezes.  CARDIAC: Regular rate, S1 and S2 noted.  ABDOMEN: Soft, nontender, nondistended. Normoactive bowel sounds noted in all 4 quadrants. PEG tube is noted in place. Suprapubic catheter is also noted in place.  EXTREMITIES: Negative for lower extremity edema.  PSYCHIATRIC: Appropriate mood and affect.  NEUROLOGICAL:  The patient does have signs of dementia.   LABORATORY AND RADIOLOGICAL DATA:  White blood cells 12.8, hemoglobin 10.7, hematocrit 34.2, platelets 209. MCV 95, sodium 138, potassium 5.1, BUN 26, creatinine 1.00, glucose is 68.   Imaging: Chest x-ray was obtained on the patient showing interstitial infiltrate likely representing edema. There was also atelectasis versus infiltrate within the lung base on the right greater than on the left with likely pleural effusion.   ASSESSMENT: 1.  Diarrhea:  Onset was after admission. The patient was given 8 days with the Zosyn. C. difficile was checked and was negative. Stool culture, however, did  note moderate growth of Candida albicans.  2.  History of dysphagia and recurrent aspiration pneumonia requiring a PEG tube: The patient currently receives Jevity  on a daily basis and has been elevating the head of the bed.  3.  History of atrial fibrillation.  4.  Chronic obstructive pulmonary disease on home oxygen:  Currently on nasal cannula in the hospital.   PLAN: I have discussed this patient's case in detail with Dr. Lynnae Prudeobert Elliott, who is involved in the development of the patient's plan of care. At this time C. diff was negative, and he is tolerating his tube feeds well. Based on the stool culture, however, there was a moderate growth of Candida albicans which can certainly be contributing to his persistent diarrhea despite discontinuing the antibiotics and initiating probiotics and Imodium. Therefore, we do recommend initiating Diflucan 100 mg per day x 7 days. We do also recommend 5 mL of nystatin to be given q.i.d.  The Candida strain his stool did appear to be different than the Candida strain in his urine; and, therefore, these will certainly need to be treated. We will initiate these medications. Continue symptomatic management with pain control and antiemetics as needed. We will continue to monitor this patient throughout hospitalization and make further recommendations pending above and per clinical course. All questions were answered.   Thank you so much for this consultation and for allowing us to participate in the patient's plan of care.   The above was discussed and agreed upon under supervisory agreement between myself and Dr. Lynnae Prudeobert Elliott.   ____________________________ Hardie ShackletonKaryn M. Kareemah Grounds, PA-C kme:cb D: 12/24/2012 17:03:50 ET T: 12/24/2012 17:26:10 ET JOB#: [id number]  cc: Hardie ShackletonKaryn M. Patrick Salemi, PA-C, <Dictator> Hardie ShackletonKARYN M Orson Rho PA ELECTRONICALLY SIGNED 12/25/2012 14:57

## 2014-12-25 NOTE — Discharge Summary (Signed)
PATIENT NAME:  Tanner Wu, Tanner Wu MR#:  [id number] DATE OF BIRTH:  1921/01/19  DATE OF ADMISSION:  04/18/2013 DATE OF DISCHARGE:  04/24/2013   DISPOSITION: Discharged to [facility].   PRESENTING COMPLAINT: Altered mental status.   DISCHARGE DIAGNOSES:  1. Acute encephalopathy/altered mental status, resolved.  2. Urinary tract infection. The patient has chronic Foley catheter with previous urinary tract infections.  3. Dementia, chronic.  4. Atrial fibrillation, rate controlled.  5. Triple abdominal aortic aneurysm 8 cm, inoperable.  6. Chronic obstructive pulmonary disease, on chronic home oxygen 2 liters per minute.  7. Dehydration, resolved.   PRIMARY CARE PHYSICIAN: J. Meredith Mody, MD, [facility], phone number [phone], extension [phone].   DISCHARGE INSTRUCTIONS:  1. Physical therapy.  2. Oxygen 2 liters per minute nasal cannula continuous.  3. Jevity 1.5 Cal RTA 60 mL per hour continuous.  4. Flush PEG tube with water per protocol.  5. Follow with [facility], Dr. Maye Hides, after discharge from rehab.  6. All medications through PEG tube.   CONSULTATIONS: 1. Palliative care consultation with Dr. Harvie Junior.  2. Cardiology, Dr. Mariah Milling.   LABORATORY DATA:  Urine culture: E. coli 40,000 colonies sensitive to nitrofurantoin, gentamicin, imipenem and ertapenem.  Blood cultures negative in 5 days.  White count is 10.5.  Basic metabolic panel within normal limits.  Cardiac enzymes within normal limits.   IMAGING: Chest x-ray showed right-sided single lead, pacemaker present. Cardiomegaly. Lung base atelectasis.   BRIEF SUMMARY OF HOSPITAL COURSE: [name] is a 81 year old Caucasian gentleman with history of chronic respiratory failure, COPD, on home oxygen, dementia, atrial fibrillation, sick sinus syndrome, status post pacemaker, cardiomyopathy with EF of 20% and an inoperable 8 cm AAA, with history of aspiration pneumonia, status post PEG tube placement, also has a suprapubic catheter  with multiple UTIs in the past, who was brought in by daughter with:   1. Acute encephalopathy/altered mental status. Initially was thought, because of his abnormal UA, to be due to another urinary tract infection. The patient's white count was normal. He did not exhibit any signs of sepsis or fever. He was given some IV fluids. His antibiotics were narrowed down to gentamicin. The patient was started on gentamicin by Dr. Imogene Burn. Not sure why gentamicin was used, but after talking with daughter, the patient has a lot of side effects to antibiotics. I am not really sure of the reaction to each of these antibiotics. HE IS ALLERGIC TO LEVOFLOXACIN, NITROFURANTOIN, PRIMAXIN AND SULFA DRUGS. The patient's daughter was unable to give me any details on these antibiotic reactions other than itching and rash. The patient completed a course with gentamicin that was monitored very closely by pharmacy. His kidney function remained stable. Discussed at length with daughter, and the same was discussed with the patient's primary care physician, Dr. Maye Hides, over the phone, who had also discussed with daughter that the patient does not have many choices left, has bare minimum of any choice left for treating his urinary tract infection. This did not seem to be a true infection at this point, given his clinical presentation and quick improvement after IV fluids itself. I did recommend to the patient's daughter that  tobramycin and aminoglycosides are not the best choice given elderly age and monitoring of renal function. Mentation is back to baseline.  2. History of recurrent aspiration, now on PEG tube feeding. The patient's PEG tube feeding was continued. He is receiving all his meds through the PEG feeding.  3. Chronic atrial fibrillation.  Heart rate remained stable.  4. Chronic obstructive pulmonary disease. The p.r.n. Xopenex was given. The patient is on 2 liters nasal cannula.  5. Abdominal aortic aneurysm, inoperable.   6. Dementia, chronic. 7. Hospital stay otherwise remained stable.   CODE STATUS: The patient is a full code.   DISCHARGE MEDICATIONS: 1. Tylenol 650 q.4 p.r.n.  2. Aspirin 81 mg daily.  3. Vasotec 5 mg daily.  4. Roxicodone 5 mg q.8 hours p.r.n.  5. Loratadine 10 mg daily.  6. Spiriva 1 capsule inhalation daily.  7. Multivitamin 5 mL daily.  8. Omega-3 fatty acid 1 capsule daily.  9. phenazopyridine tablet 100 mg p.o. t.i.d. p.r.n.  10. Alprazolam 0.25 q.8 p.r.n.  11. Flonase 1 spray both nostrils b.i.d.  12. Amiodarone 100 mg at bedtime.  13. Lanoxin 0.0625 daily.  14. Lidocaine 5% ointment apply to affected area daily.  15. Docusate 100 mg b.i.d.  16. Senokot 5 mL b.i.d. p.r.n.  17. Pantoprazole 40 mg daily.  18. Benadryl 25 mg q.6 p.r.n.   TIME SPENT: 40 minutes.   ____________________________ Wylie HailSona A. Allena KatzPatel, MD sap:OSi D: 04/24/2013 10:58:00 ET T: 04/24/2013 11:29:36 ET JOB#: [id number]  cc: Tell Rozelle A. Allena KatzPatel, MD, <Dictator> Evern BioJeannette F. Meredith ModyStein, MD, [facility] [place], phone #[phone], ext. [phone] Ned GraceNancy Phifer, MD Antonieta Ibaimothy J. Gollan, MD  Willow OraSONA A Danne Scardina MD ELECTRONICALLY SIGNED 05/08/2013 28:4120:02

## 2014-12-25 NOTE — Consult Note (Signed)
General Aspect 81 year old, very fragile, elderly Caucasian male with PMH significant for chronic respiratory failure secondary to COPD on 2 liters home oxygen, dementia, atrial fibrillation, sick sinus syndrome, status post pacemaker, congestive heart failure, EF of 20%, nonoperable 8 cm abdominal aortic aneurysm, dysphagia, multiple admissions for aspiration pneumonia, status post PEG tube placement and also has suprapubic catheter placed. Presenting with malaise, sepsis, fever, UTI. Cardiology was consulted for tachyarrhythmia concerning for VT last night.  Patient was brought in from home. Due to his dementia, the patient is unable to provide any history. The patient has been more lethargic and sleepy over the past week. Growing E. coli in the cultures as an outpt,  sensitive to only a few antibiotics. Initially he was tried on Macrobid antibiotic, but the patient developed significant itching.  he was started on Primaxin. Daughter felt that the patient was having trouble breathing and was coughing.    he was brought to the ER.   His white count is within normal limits. Urine reveals infection. He has been afebrile, slightly tachycardic  Overnight, tele showed atrial tachyarrhythmia 13 beats at 11 pm, possiblt SVT Otherwise he is in chronic atrial fibrillation with intermittent LBBB   Present Illness . SOCIAL HISTORY: The patient lives at home with daughter as the primary caretaker. No history of alcohol or drugs. The patient quit smoking more than 10 years ago.   FAMILY HISTORY: The patient's mother with congestive heart failure and father died from prostate cancer. Sister had leukemia.   Physical Exam:  GEN well developed, thin, critically ill appearing   HEENT hearing intact to voice, moist oral mucosa   NECK supple   RESP normal resp effort  wheezing  rhonchi   CARD Irregular rate and rhythm  Murmur   Murmur Systolic   Systolic Murmur Out flow   ABD soft   EXTR negative  edema   SKIN normal to palpation   NEURO motor/sensory function intact   PSYCH alert   Review of Systems:  Subjective/Chief Complaint poor historian, no complaints per patient, denies any pertinant positives   Skin: No Complaints   ENT: No Complaints   Eyes: No Complaints   Neck: No Complaints   Respiratory: No Complaints   Cardiovascular: No Complaints   Gastrointestinal: No Complaints   Genitourinary: No Complaints   Vascular: No Complaints   Musculoskeletal: No Complaints   Neurologic: No Complaints   Hematologic: No Complaints   Endocrine: No Complaints   Psychiatric: No Complaints   Review of Systems: All other systems were reviewed and found to be negative   ROS Pt not able to provide ROS   Medications/Allergies Reviewed Medications/Allergies reviewed     COPD:    Dementia:    A-FIB:    CHF:    AAA:    PEG TUBE, NPO:    Pacemaker:        Admit Diagnosis:   HYPERKALEMIA: Onset Date: 19-Apr-2013, Status: Active, Description: HYPERKALEMIA      Admit Reason:   UTI (lower urinary tract infection) (599.0): Onset Date: 18-Apr-2013, Status: Active, Coding System: ICD9, Coded Name: Urinary tract infection, site not specified  Home Medications: Medication Instructions Status  ketoconazole topical 2% topical cream Apply a small amount topically to affected area 2 times a day for eczema Active  flunisolide nasal 25 mcg/inh nasal spray 1 puff(s) nasal 2 times a day Active  guaiFENesin 100 mg/5 mL oral liquid 10 milliliter(s) orally 4 times a day Active  mupirocin topical 2%  topical ointment Apply topically to affected area 2 times a day, As Needed Active  Symbicort 160 mcg-4.5 mcg/inh inhalation aerosol 2 puff(s) inhaled 2 times a day Active  oxyCODONE 5 mg oral tablet 1 tab(s) orally 3 times a day, As Needed - for Pain Active  Glycerin adult rectal suppository 1 suppository(ies) rectal every other day, As Needed Active  digoxin 62.5 mcg (0.0625  mg) oral tablet 1 tab(s) orally once a day Active  levalbuterol 1.25 mg/3 mL inhalation solution 1 application inhaled every 6 hours Active  ketoconazole topical 2% topical shampoo Apply topically to affected area every other day (at bedtime) Active  lansoprazole 15 mg oral delayed release capsule 2 cap(s) orally once a day Active  phenazopyridine 100 mg oral tablet 1 tab(s) orally 3 times a day, As Needed Active  zinc oxide topical 20% topical ointment Apply topically to affected area 3 times a day Active  enalapril 5 mg oral tablet 1 tab(s) orally once a day Active  ipratropium 500 mcg/2.5 mL inhalation solution 2.5 milliliter(s) inhaled every 6 hours, As Needed- for Shortness of Breath  Active  Spiriva 18 mcg inhalation capsule 1 each inhaled once a day Active  multivitamin 5 milliliter(s) via PEG tube once a day Active  furosemide 20 mg oral tablet 1 tab(s) via PEG tube once a day for blood pressure and fluid control Active  Fish Oil 1000 mg oral capsule 1 cap(s) via PEG tube 2 times a day Active  aspirin 81 mg oral tablet, chewable 1 tab(s) via PEG tube once a day Active  loratadine 10 mg oral tablet 1 tab(s) via PEG tube once a day Active  acetaminophen 325 mg oral tablet 2 tab(s) via PEG tube every 6 hours, As Needed- for Pain or fever Active   Lab Results:  Hepatic:  15-Aug-14 16:42   Bilirubin, Total  1.6  Alkaline Phosphatase  178  SGPT (ALT) 38  SGOT (AST)  67  Total Protein, Serum 7.8  Albumin, Serum  3.3  TDMs:  15-Aug-14 16:42   Digoxin, Serum 0.9 (Therapeutic range for digoxin in patients with atrial fibrillation: 0.8 - 2.0 ng/mL. In patients with congestive heart failure a therapeutic range of 0.5 - 0.8 ng/mL is suggested as higher levels are associated with an increased risk of toxicity without clear evidence of enhanced efficacy. Digoxin toxicity is commonly associated with serum levels > 2.0 ng/mL but may occur with lower levels, including those in the  therapeutic range. Blood samples should be obtained 6-8 hours after administration to assure a reasonable volume of distribution.)  Routine Chem:  15-Aug-14 16:42   Glucose, Serum 77  BUN  41  Creatinine (comp) 0.84  Sodium, Serum 139  Potassium, Serum  5.4  Chloride, Serum 101  CO2, Serum  36  Calcium (Total), Serum 9.9  Anion Gap  2  Osmolality (calc) 286  eGFR (African American) >60  eGFR (Non-African American) >60 (eGFR values <37m/min/1.73 m2 may be an indication of chronic kidney disease (CKD). Calculated eGFR is useful in patients with stable renal function. The eGFR calculation will not be reliable in acutely ill patients when serum creatinine is changing rapidly. It is not useful in  patients on dialysis. The eGFR calculation may not be applicable to patients at the low and high extremes of body sizes, pregnant women, and vegetarians.)  Result Comment POTASSIUM/AST/CK - Slight hemolysis, interpret results with  - caution.  Result(s) reported on 18 Apr 2013 at 05:27PM.  B-Type Natriuretic Peptide ([facility]  3020 (Result(s) reported on 18 Apr 2013 at 06:21PM.)  Cardiac:  15-Aug-14 16:42   Troponin I  0.07 (0.00-0.05 0.05 ng/mL or less: NEGATIVE  Repeat testing in 3-6 hrs  if clinically indicated. >0.05 ng/mL: POTENTIAL  MYOCARDIAL INJURY. Repeat  testing in 3-6 hrs if  clinically indicated. NOTE: An increase or decrease  of 30% or more on serial  testing suggests a  clinically important change)  CK, Total 115  CPK-MB, Serum  4.8 (Result(s) reported on 18 Apr 2013 at 05:41PM.)  Routine Hem:  15-Aug-14 16:42   WBC (CBC) 10.5  RBC (CBC) 4.62  Hemoglobin (CBC)  12.8  Hematocrit (CBC)  39.0  Platelet Count (CBC) 169 (Result(s) reported on 18 Apr 2013 at 05:06PM.)  MCV 85  MCH 27.7  MCHC 32.7  RDW  17.3   Radiology Results: XRay:    15-Aug-14 18:00, Chest Portable Single View  Chest Portable Single View   REASON FOR EXAM:    cough  COMMENTS:   LMP:  (Male)    PROCEDURE: DXR - DXR PORTABLE CHEST SINGLE VIEW  - Apr 18 2013  6:00PM     RESULT: Comparison is made to the exam of 12/25/2012 and to a study of   12/23/2012. Right-sided single lead pacemaker device is present with a   single lead in the left ventricle. Cardiomegaly persists. There is   decreased interstitial edema compared previous studies with improved   aeration especially at the right lung base. Mild edema is not excluded.   Minimal lung base atelectasis is present without effusion or   pneumothorax. The bony structures show evidence of osteopenia.   Atherosclerotic calcification is present within the aorta. Cardiac   monitoring electrodes are present.  IMPRESSION:   1. Right-sided single lead pacemaker present.  2. Cardiomegaly.  3. Lung base atelectasis. Cannot exclude some mild interstitial edema.   Improved aeration of the right lung base and left lung base compare to   the previous exam.    Dictation Site: 6        Verified By: Sundra Aland, M.D., MD    Primaxin IV: Itching, Swelling, SOB, Hives  Sulfa drugs: Swelling, Hives  Levofloxacin: Rash  Nitrofurantoin: Hives, Itching  Albuterol: Other  Vital Signs/Nurse's Notes: **Vital Signs.:   16-Aug-14 10:57  Vital Signs Type Pre Medication  Temperature Temperature (F) 97.8  Celsius 36.5  Temperature Source oral  Pulse Pulse 81  Respirations Respirations 18  Systolic BP Systolic BP 322  Diastolic BP (mmHg) Diastolic BP (mmHg) 53  Mean BP 72  Pulse Ox % Pulse Ox % 94  Pulse Ox Activity Level  At rest  Oxygen Delivery 3L    Impression 81 year old, very fragile, elderly Caucasian male with PMH significant for chronic respiratory failure secondary to COPD on 2 liters home oxygen, dementia, atrial fibrillation, sick sinus syndrome, status post pacemaker, congestive heart failure, EF of 20%, nonoperable 8 cm abdominal aortic aneurysm, dysphagia, multiple admissions for aspiration pneumonia, status post  PEG tube placement and also has suprapubic catheter placed. Presenting with malaise, sepsis, fever, UTI. Cardiology was consulted for tachyarrhythmia concerning for SVT/atrial tachyarrhythmia last night.  1) Arrhythmia short run of 13 beats last night he is chronic atrial fib with intermittent LBBB. No symptoms, though patient is a poor historian. Underlying severe cardiomyopathy by previous echo several months ago --No further workup needed at this time --If he has frequent runs of tachycardia, could start antiarrhythmia medication Unable to start/advance B-blockers of Ca channel blockers  given low BP at this time.  2) AMS-  Possible UTI on abx  3)  UTI-  has suprapubic catheter and previous UTIs,  E.coli,  on gentamycin  4)  Dementia-  usually alert and orineted, now worse, likely from underlying infecc  5)  Afib-  rate controlled,  on digoxin  6) AAA-  8cm, nonoperable  7)  elevated troponin-  Underlying CAD,  demand ischemia in setting of infection Still in normal range essentially, could check second set   Electronic Signatures: Ida Rogue (MD)  (Signed 16-Aug-14 14:37)  Authored: General Aspect/Present Illness, History and Physical Exam, Review of System, Past Medical History, Health Issues, Home Medications, Labs, Radiology, Allergies, Vital Signs/Nurse's Notes, Impression/Plan   Last Updated: 16-Aug-14 14:37 by Ida Rogue (MD)

## 2014-12-25 NOTE — Consult Note (Signed)
PATIENT NAME:  Tanner Wu, Vittorio MR#:  [id number] DATE OF BIRTH:  [birth year]  DATE OF CONSULTATION:  12/17/2012  CONSULTING PHYSICIAN:  Laurier NancyShaukat A. Oluwatosin Bracy, MD  INDICATION FOR CONSULTATION: Congestive heart failure.  HISTORY OF PRESENT ILLNESS: This is a 81 year old African American male with a past medical history of COPD, CHF, repeated episodes of pneumonia, dementia, with a history PEG placement, came into the hospital from nursing home because of shortness of breath. The patient is quite confused, unable to give much history with history of significant dementia. He appears to be comfortable. According to his family he just was coughing a lot and was short of breath, thus was brought to the Emergency Room. Presumptive diagnosis has been aspiration pneumonia. Because his BNP was elevated, thus I was asked to evaluate the patient.   PAST MEDICAL HISTORY:  As mentioned, has a history of COPD, history of CHF, history of atrial fibrillation status post pacemaker implantation, history of AAA, history of PEG placement.   ALLERGIES:  ALBUTEROL, LEVOFLOXACIN AND SULFA.  MEDICATIONS:  Digoxin 0.0625 b.i.d., aspirin 81 mg, amiodarone 100 mg, Lasix 20 mg once a day, Protonix 40 mg once a day.   PHYSICAL EXAMINATION: VITAL SIGNS: Temperature is 98.2, pulse 78, respirations 18, blood pressure 118/79, pulse ox is 95.  He is alert and oriented x 0.  HEENT:  Reveals positive JVD about 6 cm.  LUNGS:  Good air entry. No rales. No wheezing.  HEART:  Regular rate and rhythm. No audible murmur.  ABDOMEN:  Soft, nontender, positive bowel sounds.  EXTREMITIES:  No pedal edema.  NEUROLOGICAL:  He is alert, oriented x 0.   LABORATORY DATA:  His glucose is 153, BUN 36. Creatinine is 1.06.  His BNP was 3560.   EKG shows AV sequential rhythm about 60 beats per minute, right bundle branch block, nonspecific ST-T changes.   ASSESSMENT AND PLAN: The patient has elevated BNP with presumptive diagnosis of congestive heart  failure and aspiration pneumonia. Advise getting echocardiogram and currently he is on Lasix. I advise adding enalapril 5 mg p.o. daily in addition to Lasix and will evaluate his ejection fraction by echocardiogram and make further recommendations.  Thank you very much for the referral.    ____________________________ Laurier NancyShaukat A. Peja Allender, MD sak:ce D: 12/17/2012 16:31:00 ET T: 12/17/2012 16:57:18 ET JOB#: [id number]  cc: Laurier NancyShaukat A. Verle Brillhart, MD, <Dictator> Laurier NancySHAUKAT A Dezmon Conover MD ELECTRONICALLY SIGNED 12/27/2012 8:51

## 2014-12-25 NOTE — Consult Note (Signed)
Consult received, will do early tomorrow morning.  Electronic Signatures: Scot JunElliott, Hartleigh Edmonston T (MD)  (Signed on 21-Apr-14 18:32)  Authored  Last Updated: 21-Apr-14 18:32 by Scot JunElliott, Garnett Nunziata T (MD)

## 2014-12-25 NOTE — H&P (Signed)
PATIENT NAME:  Tanner Wu, MELANDER MR#:  [id number] DATE OF BIRTH:  1921-08-22  DATE OF ADMISSION:  05/23/2013  PRIMARY CARE PHYSICIAN: Dr. Meredith Mody at [facility].   CHIEF COMPLAINT: Lethargy, unresponsive.   HISTORY OF PRESENT ILLNESS: Mr. 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 fib, history of inoperable AAA which is 8 cm, and history of PEG tube placement secondary to chronic dysphagia and multiple admissions for aspiration pneumonia. Comes into the Emergency Room, brought in by EMS after daughter called in EMTs for patient's mental status gradually deteriorating as the day progressed. The patient was recently discharged from [facility] in [place] after he was admitted for a C1-C2 cervical fracture status post fall a couple of weeks ago. The patient was at home, followed by home health services, was noted to get lethargic over the day and barely able to keep his eyes open thereafter. He became unresponsive, brought to the Emergency Room where he was found to have CO2 narcosis with PaCO2 on ABG of 120. The patient was emergently intubated after discussing with the patient's daughter [name], [place], who said patient was FULL CODE. He. He also became hypotensive with blood pressure in the 80s and is started currently on dopamine. The patient is being admitted for further evaluation and management. His chest x-ray shows no evidence of pneumonia. His urinalysis does not show any evidence infection. The patient does have a chronic suprapubic catheter.   ALLERGIES: PRIMAXIN, NITROFURANTOIN, SULFA AND ALBUTEROL.   MEDICATIONS: 1.  Zinc oxide apply topically on the buttocks bilaterally as needed.  2.  Xopenex nebulizers q.6 hourly.  3.  Xanax 0.5 mg once a day via PEG.  4.  Spiriva 80 mcg inhalation daily.  5.  Senokot liquid 5 mL via PEG b.i.d.  6.  Protonix 40 mg via PEG once a day.  7.  Phenazopyridine 100  mg via PEG 3 times a day.  8.  Oxycodone 5 mg via PEG every 8 hourly.  9.  Omega-3 fatty acid 2 capsules via PEG once a day.  10.  Multivitamin 5 mL daily.  11.  Loratadine 10 mg daily.  12.  Lanoxin 0.0625 via PEG daily.  13.  Flonase 50 mcg per inhalation 1 spray b.i.d.  14.  Enalapril 5 mg daily.  15.  Docusate 10 mL via PEG b.i.d.  16.  Diphenhydramine 25 mg 1 tablet via PEG every 6 hours as needed.  17.  Aspirin 81 mg via PEG once a day.  18.  Anusol-HC suppository b.i.d.  19.  Amiodarone 100 mg via PEG daily.  20.  Tylenol 650 mg via PEG daily.   PAST MEDICAL HISTORY: 1.  Chronic respiratory failure secondary to COPD, on home oxygen.  2.  Chronic dementia.  3.  Chronic A. fib.  4.  History of sick sinus syndrome, status post pacemaker.  5.  CHF, EF of 20%.  6.  An 8 cm nonoperable AAA.  7.  Chronic dysphagia with chronic aspiration pneumonia, now status post PEG tube.  8.  Status post suprapubic catheter with multiple UTIs and colonization in the past.  9.  Posttraumatic stress disorder.   SOCIAL HISTORY: Lives at home currently with daughter, who is the primary caretaker. No history of alcohol or drug use. The patient quit smoking more than 10 years ago.   FAMILY HISTORY: From old records, mother with congestive heart failure. Father died from prostate cancer.  Sister had leukemia.   REVIEW OF SYSTEMS: Unobtainable, patient intubated on the vent.   PHYSICAL EXAMINATION: GENERAL: The patient is currently intubated on the vent. He is a frail, elderly 81 year old gentleman.  VITAL SIGNS: Temperature is 98.1. Pulse is 120. Blood pressure is 94/63. Sats are 100% on current vent settings.  HEENT: Atraumatic, normocephalic. Pupils: PERRLA. EOM intact. Oral mucosa is dry.  NECK: The patient does have a neck collar. Trachea midline.  RESPIRATORY: Clear to auscultation. Distant breath sounds. No wheezing heard. No use of accessory muscles.  CARDIOVASCULAR: Tachycardia present. No  murmur heard. PMI not lateralized. Chest nontender.  ABDOMEN: Soft, scaphoid,, nontender. No organomegaly noted. The patient has a PEG tube placed. Suprapubic catheter present over the abdomen.  EXTREMITIES: Feeble pedal pulses, good femoral pulses. No lower extremity edema.  NEUROLOGIC: Unable to assess.  PSYCHIATRIC: The patient is intubated on the vent and sedated.   LABORATORY DATA: Urinalysis negative for UTI. White count is 16.4; hemoglobin and hematocrit are 13.7 and 43.9; platelet count is 312. Glucose is 198; BUN is 54; creatinine is 1.52; sodium is 151; potassium is 4.1; chloride is 108; bicarbonate is 43; calcium is 1.4; bilirubin is 0.7; alkaline phosphatase is 175; SGOT is 40; total protein is 8.4. Troponin is 0.15. CK total is 36. His pH is 7.11; pO2 is 269; pCO2 is 120; this is on nonrebreather.   ASSESSMENT AND PLAN: A 81 year old Mr. Tanner Wu with history of chronic atrial fibrillation, chronic obstructive pulmonary disease on home oxygen, recent history of cervical C1-C2 fracture status post fall, comes in with: 1.  Acute metabolic encephalopathy, suspected due to CO2 narcosis. The patient is intubated on the vent. He is going to be admitted in the Intensive Care Unit. Will keep him on IV Solu-Medrol around-the-clock. Breathing treatment will be initiated according to vent protocol. The patient will be seen by Dr. Meredeth IdeFleming. The case was discussed with him. Vent management per Dr. Meredeth IdeFleming. Chest x-ray does not show any infiltrate. Will hold off on any antibiotics at this time. The patient recently got antibiotics with vancomycin and Zosyn [facility] possible urinary tract infection and mild respiratory infection.  2.  CO2 flare with end-stage chronic obstructive pulmonary disease, on home oxygen: The patient currently is intubated secondary to CO2 narcosis. Will continue breathing treatments around-the-clock along with inhalers. Dr. Meredeth IdeFleming to see patient  and help with management.  3.  Hyponatremia with acute renal failure: Give IV fluids with half-normal saline. Follow up ins and outs and metabolic panel.  4.  Hypercalcemia, likely due to dehydration: Again, continue to follow labs after hydration. If calcium still appears to be elevated, consider further work-up and check intact PTH at that time.  5.  History of multiple urinary tract infections with colonization and resistant bacteriuria: At this time, the patient's urinalysis looks okay. I will hold off on any antibiotics. His white count is elevated to 16,000. This could be reactive. Will continue to monitor fever curve. If the patient spikes temperature, will start some broad-spectrum antibiotics.  6.  History of chronic dysphagia and with history of multiple admissions for aspiration pneumonia: Will start PEG tube feeding, have dietitian see patient in the morning.  7.  Deep vein thrombosis prophylaxis: Subcutaneous heparin.  8.  Further work-up according to the patient's clinical course. Hospital admission plan was discussed with patient, the patient's daughter and other family members. The patient's daughter, Myrla HalstedKim Wright, is the HPOA. She wants the  patient to be FULL CODE. Critical nature of illness was explained to the patient and family. They voice understanding. Will hold off on palliative care consultation at this time.   CRITICAL TIME SPENT: 60 minutes.    ____________________________ Wylie Hail Allena Katz, MD sap:jm D: 05/23/2013 16:33:17 ET T: 05/23/2013 17:03:41 ET JOB#: [id number]  cc: Takota Cahalan A. Allena Katz, MD, <Dictator> Dr. Meredith Mody, [facility] Willow Ora MD ELECTRONICALLY SIGNED 05/24/2013 15:04

## 2014-12-25 NOTE — H&P (Signed)
PATIENT NAME:  Tanner Wu, Tanner Wu MR#:  [id number] DATE OF BIRTH:  March 05, 1921  DATE OF ADMISSION:  12/14/2012  PRIMARY CARE PROVIDER: At [facility] [place].  EMERGENCY DEPARTMENT REFERRING PHYSICIAN: Dr. Mindi Junker.   CHIEF COMPLAINT: Brought in by daughters due to  patient having shortness of breath, coughing, fever.   HISTORY OF PRESENT ILLNESS: The patient is a 81 year old white male with long-standing history of dementia for more than 10 years with history of multiple admissions for aspiration pneumonia, status post PEG tube placement in April 2013, who was last hospitalized here in February with aspiration pneumonia, sepsis, who is brought back with similar type of symptoms ongoing for the past few days. The patient again is thought to have possible pneumonia as well as a UTI and I am asked to admit the patient. He otherwise is nonverbal and is unable to give me any further history. Also, around his suprapubic catheter there is some erythema and some drainage. The patient is complete n.p.o. and gets tube feeds and gets all his meds via the PEG tube.   PAST MEDICAL HISTORY: Significant for:  1.  Long-standing dementia for longer than 10 years. 2.  Chronic respiratory failure, on 2 liters.  3.  COPD.  4.  History of atrial fibrillation, status post pacemaker placement.  5.  History of CHF, unknown ejection fraction.  6.  AAA, 8 cm, not a surgical candidate. According to the daughter, it is at the higher arch.  7.  Status post PEG tube placement, status post suprapubic catheter placement.  8.  History of multiple aspiration pneumonias and chronic dysphagia per daughter.   ALLERGIES: ALBUTEROL, LEVOFLOXACIN AND SULFA DRUGS.  MEDICATIONS: Tylenol 650 q.6 p.r.n., alprazolam 1 mg via PEG tube b.i.d. as needed, amiodarone 200 mg 1/2 tab daily, aspirin 81 mg 1 tab via PEG tube, Children's Allergy 10 mL 2 tbsp via PEG tube 3 times a day, digoxin 0.5 via PEG tube daily, docusate 10 mL via PEG tube b.i.d., fish oil 1000  mg 1 cap b.i.d. via PEG tube, Lasix 20 daily, glycerin adult suppositories rectally 2 times a day as needed, ipratropium q.6 p.r.n., ketaconazole topically applied to affected area, levalbuterol 3 mL 3 times a day as needed, loratadine 10 daily, multivitamins daily, bupropion topically applied to toe scab daily, oxycodone 5 mg q.8 p.r.n., Spiriva 18 mcg daily.   SOCIAL HISTORY: Long-time smoker, smokes up until age of 51. No alcohol or drug use.   REVIEW OF SYSTEMS: Unable to obtain due to the patient's dementia.   PHYSICAL EXAMINATION:  VITAL SIGNS: Temperature 97.6, pulse 60, respirations 36, blood pressure 104/61, O2 sat 100% on 2 liters.  GENERAL: The patient is a very chronically debilitated male, is breathing via his mouth, mild respiratory distress.  HEENT: He has temporal wasting. Normocephalic, atraumatic. Pupils are equally round and reactive to light. Mouth is very dry. Poor dentition. There is no exudate.  NECK: Supple. No thyromegaly. No carotid bruits. No cervical lymphadenopathy.  CARDIOVASCULAR: S1, S2 positive. No murmurs, rubs, clicks or gallops.  LUNGS: Decreased breath sounds without any rales, rhonchi or wheezing.  ABDOMEN: With a PEG tube in place. No erythema. No drainage. There is no tenderness. No guarding. Positive bowel sounds x 4.  EXTREMITIES: No clubbing, cyanosis or edema.  GENITOURINARY: Suprapubic catheter with surrounding erythema with some yellowish drainage.  NEUROLOGICAL: The patient is not following commands to do a neurologic exam.  PSYCHIATRIC: Awake, alert, not communicating.  SKIN: No rash.  LYMPHATIC: No lymph nodes  palpable.   LABORATORY AND RADIOLOGICAL DATA: BMP: Glucose 111, BUN 35, creatinine 1.07, sodium 138, potassium 4.0, chloride 97, CO2 of 38, calcium 9.0, total protein 6.7, albumin 3.0, bilirubin total 2.0, alkaline phosphatase 118. CPK 39, troponin 0.10. WBC 15.4, hemoglobin 10.4, platelet count 149. Urinalysis: 3+ leukocytes, WBCs 254.  Chest x-ray with a possible left lower lobe infiltrate; official radiology read is currently pending.   ASSESSMENT AND PLAN: The patient is a 81 year old with percutaneous endoscopic gastrostomy tube, suprapubic catheter, brought in by daughter due to shortness of breath, cough, fever.  1.  Acute respiratory failure: Likely due to chronic obstructive pulmonary disease flare and pneumonia. At this time, we will continue nebulizers with ipratropium and Xopenex. We will place him on intravenous Solu-Medrol, also intravenous antibiotics with Zosyn that should cover aspiration pneumonia.  2.  Urinary tract infection, possible pneumonia: Continue Zosyn. Follow urine cultures, sputum cultures. 3.  Dysphagia: We will continue tube feeds with aspiration precautions.  4.  Aortic aneurysm at the aortic arch, 8 cm according to family: Prognosis poor, not a surgical candidate.  5.  Code status: Discussed with daughter. He was DO NOT RESUSCITATE previously. Now she wants him to have no cardiopulmonary resuscitation, but wants intubation and other chemical resuscitation. I will ask palliative care team to see again.   TIME SPENT: 45 minutes.   ____________________________ Lacie ScottsShreyang H. Allena KatzPatel, MD shp:jm D: 12/14/2012 19:58:23 ET T: 12/14/2012 21:05:24 ET JOB#: [id number]  cc: Ayliana Casciano H. Allena KatzPatel, MD, <Dictator> Charise CarwinSHREYANG H Sharone Picchi MD ELECTRONICALLY SIGNED 12/17/2012 20:34
